# Patient Record
Sex: Male | Born: 1980 | Race: White | Hispanic: No | Marital: Married | Smoking: Never smoker
Health system: Southern US, Community
[De-identification: ages and names within clinical notes are randomized; demographics above are authoritative.]

## PROBLEM LIST (undated history)

## (undated) DIAGNOSIS — G709 Myoneural disorder, unspecified: Secondary | ICD-10-CM

## (undated) HISTORY — PX: SPINE SURGERY: SHX786

## (undated) HISTORY — DX: Myoneural disorder, unspecified: G70.9

---

## 2014-05-26 ENCOUNTER — Ambulatory Visit (INDEPENDENT_AMBULATORY_CARE_PROVIDER_SITE_OTHER): Payer: Self-pay | Admitting: Physician Assistant

## 2014-05-26 VITALS — BP 118/80 | HR 108 | Temp 102.6°F | Resp 18 | Ht 68.0 in | Wt 180.8 lb

## 2014-05-26 DIAGNOSIS — R509 Fever, unspecified: Secondary | ICD-10-CM

## 2014-05-26 DIAGNOSIS — J101 Influenza due to other identified influenza virus with other respiratory manifestations: Secondary | ICD-10-CM

## 2014-05-26 DIAGNOSIS — R05 Cough: Secondary | ICD-10-CM

## 2014-05-26 DIAGNOSIS — R059 Cough, unspecified: Secondary | ICD-10-CM

## 2014-05-26 LAB — POCT INFLUENZA A/B
INFLUENZA A, POC: POSITIVE
INFLUENZA B, POC: NEGATIVE

## 2014-05-26 MED ORDER — HYDROCODONE-HOMATROPINE 5-1.5 MG/5ML PO SYRP: 5.0000 mL | ORAL_SOLUTION | Freq: Three times a day (TID) | ORAL | Status: DC | PRN

## 2014-05-26 MED ORDER — ACETAMINOPHEN 325 MG PO TABS
1000.0000 mg | ORAL_TABLET | Freq: Once | ORAL | Status: AC
  Administered 2014-05-26: 975 mg via ORAL

## 2014-05-26 MED ORDER — OSELTAMIVIR PHOSPHATE 75 MG PO CAPS: 75.0000 mg | ORAL_CAPSULE | Freq: Two times a day (BID) | ORAL | Status: DC

## 2014-05-26 MED ORDER — IBUPROFEN 800 MG PO TABS: ORAL_TABLET | ORAL | Status: DC

## 2014-05-26 NOTE — Progress Notes (Signed)
05/26/2014 at 11:23 AM  Brian Simon / DOB: 06-16-1980 / MRN: [id number]  The patient  does not have a problem list on file.  SUBJECTIVE  Chief compalaint: Fever and Cough   History of present illness: Brian Simon is a nonsmoking 34 y.o. ill appearing male with a history of neurofibromatosis presenting for moderatly high fever. This problem has been present for 2 days and is not getting worse or better. Associated symptoms include cough, HA, myalgias and he denies sore throat, neck pain, rhinorrhea, and sinus congestion, belly pain, nausea, and changes in fluid intake or appetitie. He has tried Nyquil and Dayquil with good to fair relief. He reports his fiancee is here because she has been ill.    He  has a past medical history of Neuromuscular disorder.    He currently has no medications in their medication list.  Brian Simon has No Known Allergies. He  reports that he has never smoked. He does not have any smokeless tobacco history on file. He reports that he drinks alcohol. He reports that he does not use illicit drugs. He  has no sexual activity history on file.  The patient  has past surgical history that includes Spine surgery.  His family history includes Cancer in his paternal grandfather and paternal grandmother; Stroke in his maternal grandfather.  Review of Systems  Constitutional: Positive for fever and chills.  Respiratory: Positive for cough. Negative for shortness of breath and wheezing.   Cardiovascular: Negative for chest pain and palpitations.  Gastrointestinal: Negative.   Genitourinary: Negative.   Skin: Negative.   Neurological: Positive for headaches. Negative for dizziness.    OBJECTIVE  his  height is  (1.727 m) and weight is 180 lb 12.8 oz (82.01 kg). His oral temperature is 102.6 F (39.2 C). His blood pressure is 118/80 and his pulse is 108. His respiration is 18 and oxygen saturation is 98%.  The patient's body mass index is 27.5  kg/(m^2).  Physical Exam  Vitals reviewed. Constitutional: He is oriented to person, place, and time. He appears well-developed and well-nourished. No distress.  HENT:  Right Ear: Hearing, tympanic membrane, external ear and ear canal normal.  Left Ear: Hearing, tympanic membrane, external ear and ear canal normal.  Nose: Nose normal.  Mouth/Throat: Uvula is midline, oropharynx is clear and moist and mucous membranes are normal.  Eyes:  Glassy  Cardiovascular: Normal rate, regular rhythm and normal heart sounds.  Exam reveals no gallop and no friction rub.   No murmur heard. Respiratory: Effort normal and breath sounds normal. He has no wheezes. He has no rales.  GI: Bowel sounds are normal. He exhibits no distension and no mass. There is no tenderness. There is no rebound and no guarding.  Lymphadenopathy:    He has no cervical adenopathy.  Neurological: He is alert and oriented to person, place, and time.  Skin: Skin is warm and dry.  Psychiatric: He has a normal mood and affect.    Results for orders placed or performed in visit on 05/26/14 (from the past 24 hour(s))  POCT Influenza A/B     Status: None   Collection Time: 05/26/14 10:03 AM  Result Value Ref Range   Influenza A, POC Positive    Influenza B, POC Negative     ASSESSMENT & PLAN  Press was seen today for fever and cough.  Diagnoses and associated orders for this visit:  Fever, unspecified fever cause - acetaminophen (TYLENOL) tablet 975 mg;  Take 3 tablets (975 mg total) by mouth once. - POCT Influenza A/B  Influenza A: Anticipatory guidance provided.  He is to avoid the very young, pregnant individuals, and the elderly or infirmed. Advised he should be getting better in a day or two, and if not, or if he becomes dyspneic to represent.    - oseltamivir (TAMIFLU) 75 MG capsule; Take 1 capsule (75 mg total) by mouth 2 (two) times daily. - ibuprofen (ADVIL,MOTRIN) 800 MG tablet; Take 1 tab every eight hours for  pain  Cough - HYDROcodone-homatropine (HYCODAN) 5-1.5 MG/5ML syrup; Take 5 mLs by mouth every 8 (eight) hours as needed for cough.     The patient was instructed to to call or comeback to clinic as needed, or should symptoms warrant.  Brian BostonMichael Swati Simon, MHS, PA-C [facility] 05/26/2014 11:23 AM

## 2016-05-24 ENCOUNTER — Ambulatory Visit (INDEPENDENT_AMBULATORY_CARE_PROVIDER_SITE_OTHER): Payer: BC Managed Care – PPO | Admitting: Physician Assistant

## 2016-05-24 VITALS — BP 122/72 | HR 78 | Temp 98.0°F | Resp 17 | Ht 68.5 in | Wt 187.0 lb

## 2016-05-24 DIAGNOSIS — J069 Acute upper respiratory infection, unspecified: Secondary | ICD-10-CM | POA: Diagnosis not present

## 2016-05-24 DIAGNOSIS — B9789 Other viral agents as the cause of diseases classified elsewhere: Secondary | ICD-10-CM | POA: Diagnosis not present

## 2016-05-24 NOTE — Progress Notes (Signed)
  05/24/2016 3:04 PM   DOB: 05/04/1980 / MRN: [id number]  SUBJECTIVE:  Brian Simon is a 36 y.o. male presenting for "feeling under the weather since Sunday."  States this has been fever which he has measured at 99.6.  Complains of non productive cough and muscle aches. The cough is not worse at night. He is a never smoker and denies a history of asthma.   He has No Known Allergies.   Review of Systems  HENT: Positive for sore throat. Negative for congestion, ear pain and sinus pain.   Respiratory: Positive for cough. Negative for hemoptysis, shortness of breath and wheezing.   Cardiovascular: Negative for chest pain and leg swelling.  Skin: Negative for rash.  Neurological: Positive for headaches. Negative for dizziness.    The problem list and medications were reviewed and updated by myself where necessary and exist elsewhere in the encounter.   OBJECTIVE:  BP 122/72 (BP Location: Right Arm, Patient Position: Sitting, Cuff Size: Normal)   Pulse 78   Temp 98 F (36.7 C) (Oral)   Resp 17   Ht 5' 8.5" (1.74 m)   Wt 187 lb (84.8 kg)   SpO2 97%   BMI 28.02 kg/m   Physical Exam  Constitutional: He is oriented to person, place, and time. He appears well-developed. He does not appear ill.  HENT:  Right Ear: Tympanic membrane normal.  Left Ear: Tympanic membrane normal.  Nose: Nose normal.  Mouth/Throat: Uvula is midline, oropharynx is clear and moist and mucous membranes are normal.  Eyes: Conjunctivae and EOM are normal. Pupils are equal, round, and reactive to light.  Cardiovascular: Normal rate, regular rhythm and normal heart sounds.  Exam reveals no gallop, no friction rub and no decreased pulses.   No murmur heard. Pulmonary/Chest: Effort normal and breath sounds normal. No respiratory distress. He has no decreased breath sounds. He has no wheezes. He has no rhonchi. He has no rales.  Abdominal: He exhibits no distension.  Musculoskeletal: Normal range of motion. He  exhibits no edema.  Neurological: He is alert and oriented to person, place, and time. No cranial nerve deficit. Coordination normal.  Skin: Skin is warm and dry. He is not diaphoretic.  Psychiatric: He has a normal mood and affect.  Nursing note and vitals reviewed.   No results found for this or any previous visit (from the past 72 hour(s)).  No results found.  ASSESSMENT AND PLAN:  Brian Simon was seen today for uri, cough and sore throat.  Diagnoses and all orders for this visit:  Viral URI: Reassuring exam.  Advised rest and OTC cold prep.  Advised it may take 10 or so days of total illness before he makes a full recovery.     The patient is advised to call or return to clinic if he does not see an improvement in symptoms, or to seek the care of the closest emergency department if he worsens with the above plan.   Deliah BostonMichael Javarri Segal, MHS, PA-C [facility] 05/24/2016 3:04 PM

## 2016-05-24 NOTE — Patient Instructions (Signed)
     IF you received an x-ray today, you will receive an invoice from [facility]. Please contact [facility] at [phone] with questions or concerns regarding your invoice.   IF you received labwork today, you will receive an invoice from [facility]. Please contact [facility] at [phone] with questions or concerns regarding your invoice.   Our billing staff will not be able to assist you with questions regarding bills from these companies.  You will be contacted with the lab results as soon as they are available. The fastest way to get your results is to activate your My Chart account. Instructions are located on the last page of this paperwork. If you have not heard from us regarding the results in 2 weeks, please contact this office.     

## 2016-09-15 ENCOUNTER — Ambulatory Visit (INDEPENDENT_AMBULATORY_CARE_PROVIDER_SITE_OTHER): Payer: BC Managed Care – PPO | Admitting: Family Medicine

## 2016-09-15 ENCOUNTER — Encounter: Payer: Self-pay | Admitting: Family Medicine

## 2016-09-15 VITALS — BP 138/84 | HR 85 | Temp 99.5°F | Resp 17 | Ht 68.5 in | Wt 186.0 lb

## 2016-09-15 DIAGNOSIS — R509 Fever, unspecified: Secondary | ICD-10-CM

## 2016-09-15 DIAGNOSIS — B349 Viral infection, unspecified: Secondary | ICD-10-CM

## 2016-09-15 DIAGNOSIS — R05 Cough: Secondary | ICD-10-CM

## 2016-09-15 DIAGNOSIS — R059 Cough, unspecified: Secondary | ICD-10-CM

## 2016-09-15 MED ORDER — BENZONATATE 100 MG PO CAPS: 100.0000 mg | ORAL_CAPSULE | Freq: Three times a day (TID) | ORAL | 0 refills | Status: DC | PRN

## 2016-09-15 NOTE — Progress Notes (Signed)
Patient ID: ADETOKUNBO MCCADDEN, male    DOB: [birth year]  Age: 36 y.o. MRN: [id number]  Chief Complaint  Patient presents with  . Cough  . Fever    Subjective:   36 year old gentleman who is here with a respiratory tract infection for the past couple days. He had a lot of sinus congestion type symptoms yesterday. He has been coughing. He also had a fever, up to 102 at times. He has felt bad. He did continue to work yesterday. He went to work this morning and is here to be checked but plans to do his usual work today and tomorrow, though he would like a little lighter duty. His wife is sick couple weeks ago and had some Delsym.  He is generally pretty healthy though he has a history of neurofibromatosis and this has surgeries related to that. Not on any regular medications.  Current allergies, medications, problem list, past/family and social histories reviewed.  Objective:  BP 138/84   Pulse 85   Temp 99.5 F (37.5 C) (Oral)   Resp 17   Ht 5' 8.5" (1.74 m)   Wt 186 lb (84.4 kg)   SpO2 98%   BMI 27.87 kg/m   No major acute distress. TMs normal. Throat clear. Uvula is a tiny bit swollen. Neck supple without significant nodes. Chest is clear to auscultation. Heart regular without murmur.  Assessment & Plan:   Assessment: 1. Viral syndrome   2. Cough   3. Fever, unspecified fever cause       Plan: See instructions. This is a viral syndrome and will have to be treated symptomatically. He is not sure whether he had a flu shot back in the fall, but his symptoms are more that of a bad viral cold infection rather than influenza and I'm not testing him today.  No orders of the defined types were placed in this encounter.   Meds ordered this encounter  Medications  . ibuprofen (ADVIL,MOTRIN) 200 MG tablet    Sig: Take 200 mg by mouth every 6 (six) hours as needed.         Patient Instructions   Drink plenty of fluids and get enough rest  Take ibuprofen 200 mg 3 or 4 pills  3 times daily as needed for fever. If necessary you can also take Tylenol (acetaminophen) 500 mg 2 pills 3 times daily  Use the Delsym that you have at home as needed for nighttime cough.  He is benzonatate cough pills one or 2 pills 3 times daily if needed for daytime cough  Take an over-the-counter antihistamine decongestant such as Claritin-D or Allegra-D or Zyrtec-D (loratadine D, fexofenadine D, or cetirizine D) daily for head congestion  This should run its course in 5 or 6 days. If you're getting worse at any time please return. Try to take it a little easier at work.     IF you received an x-ray today, you will receive an invoice from [facility]. Please contact [facility] at [phone] with questions or concerns regarding your invoice.   IF you received labwork today, you will receive an invoice from [facility]. Please contact [facility] at [phone] with questions or concerns regarding your invoice.   Our billing staff will not be able to assist you with questions regarding bills from these companies.  You will be contacted with the lab results as soon as they are available. The fastest way to get your results is to activate your My Chart account. Instructions are located  on the last page of this paperwork. If you have not heard from us regarding the results in 2 weeks, please contact this office.         Return if symptoms worsen or fail to improve.   HOPPER,DAVID, MD 09/15/2016

## 2016-09-15 NOTE — Patient Instructions (Addendum)
Drink plenty of fluids and get enough rest  Take ibuprofen 200 mg 3 or 4 pills 3 times daily as needed for fever. If necessary you can also take Tylenol (acetaminophen) 500 mg 2 pills 3 times daily  Use the Delsym that you have at home as needed for nighttime cough.  He is benzonatate cough pills one or 2 pills 3 times daily if needed for daytime cough  Take an over-the-counter antihistamine decongestant such as Claritin-D or Allegra-D or Zyrtec-D (loratadine D, fexofenadine D, or cetirizine D) daily for head congestion  This should run its course in 5 or 6 days. If you're getting worse at any time please return. Try to take it a little easier at work.     IF you received an x-ray today, you will receive an invoice from [facility]. Please contact [facility] at [phone] with questions or concerns regarding your invoice.   IF you received labwork today, you will receive an invoice from [facility]. Please contact [facility] at [phone] with questions or concerns regarding your invoice.   Our billing staff will not be able to assist you with questions regarding bills from these companies.  You will be contacted with the lab results as soon as they are available. The fastest way to get your results is to activate your My Chart account. Instructions are located on the last page of this paperwork. If you have not heard from us regarding the results in 2 weeks, please contact this office.

## 2018-04-25 ENCOUNTER — Other Ambulatory Visit: Payer: Self-pay

## 2018-04-25 ENCOUNTER — Ambulatory Visit (HOSPITAL_COMMUNITY)
Admission: EM | Admit: 2018-04-25 | Discharge: 2018-04-25 | Disposition: A | Discharge status: Home / Self Care | Payer: BC Managed Care – PPO | Attending: Family Medicine | Admitting: Family Medicine

## 2018-04-25 ENCOUNTER — Encounter (HOSPITAL_COMMUNITY): Payer: Self-pay | Admitting: Emergency Medicine

## 2018-04-25 DIAGNOSIS — R69 Illness, unspecified: Secondary | ICD-10-CM | POA: Insufficient documentation

## 2018-04-25 DIAGNOSIS — J111 Influenza due to unidentified influenza virus with other respiratory manifestations: Secondary | ICD-10-CM

## 2018-04-25 MED ORDER — OSELTAMIVIR PHOSPHATE 75 MG PO CAPS: 75.0000 mg | ORAL_CAPSULE | Freq: Two times a day (BID) | ORAL | 0 refills | Status: DC

## 2018-04-25 NOTE — ED Triage Notes (Signed)
PT reports a fever with no other symptoms for a few days. PT reports highest temp at home was 102.3  PT has taken ibuprofen and tylenol on rotating schedule without fever relief.

## 2018-04-25 NOTE — Discharge Instructions (Addendum)
We will go ahead and treat for flu with tamiflu Every 12 hours x 5 days. Tylenol/ibuprofen for the fever and aches Stay hydrated.  Follow up as needed for continued or worsening symptoms

## 2018-04-29 NOTE — ED Provider Notes (Signed)
[facility]    CSN: [id number] Arrival date & time: 04/25/18  1114     History   Chief Complaint Chief Complaint  Patient presents with  . Fever    HPI Brian Simon is a 38 y.o. male.   Patient is a 38 year old male presents with 3 days of fever and body aches.  His symptoms have been constant.  He has been taking ibuprofen to treat the fever.  His symptoms are somewhat relieved with the medication.  He denies any other associated symptoms to include cough, congestion, rhinorrhea, sore throat, ear pain, nausea, vomiting, diarrhea.  Denies any known recent sick contacts.  Denies any recent traveling.  No rashes  ROS per HPI      Past Medical History:  Diagnosis Date  . Neuromuscular disorder (HCC)     There are no active problems to display for this patient.   Past Surgical History:  Procedure Laterality Date  . SPINE SURGERY         Home Medications    Prior to Admission medications   Medication Sig Start Date End Date Taking? Authorizing Provider  ibuprofen (ADVIL,MOTRIN) 200 MG tablet Take 200 mg by mouth every 6 (six) hours as needed.   Yes [provider]  benzonatate (TESSALON) 100 MG capsule Take 1-2 capsules (100-200 mg total) by mouth 3 (three) times daily as needed. 09/15/16   Peyton NajjarHopper, David H, MD  oseltamivir (TAMIFLU) 75 MG capsule Take 1 capsule (75 mg total) by mouth every 12 (twelve) hours. 04/25/18   Janace ArisBast, Paetyn Pietrzak A, NP    Family History Family History  Problem Relation Age of Onset  . Stroke Maternal Grandfather   . Cancer Paternal Grandmother   . Cancer Paternal Grandfather     Social History Social History   Tobacco Use  . Smoking status: Never Smoker  . Smokeless tobacco: Never Used  Substance Use Topics  . Alcohol use: Yes    Alcohol/week: 0.0 standard drinks  . Drug use: No     Allergies   Patient has no known allergies.   Review of Systems Review of Systems   Physical Exam Triage Vital Signs ED  Triage Vitals [04/25/18 1315]  Enc Vitals Group     BP (!) 149/89     Pulse Rate (!) 105     Resp 16     Temp 99.8 F (37.7 C)     Temp Source Oral     SpO2 97 %     Weight      Height      Head Circumference      Peak Flow      Pain Score 0     Pain Loc      Pain Edu?      Excl. in GC?    No data found.  Updated Vital Signs BP (!) 149/89   Pulse (!) 105   Temp 99.8 F (37.7 C) (Oral)   Resp 16   SpO2 97%   Visual Acuity Right Eye Distance:   Left Eye Distance:   Bilateral Distance:    Right Eye Near:   Left Eye Near:    Bilateral Near:     Physical Exam Vitals signs and nursing note reviewed.  Constitutional:      General: He is not in acute distress.    Appearance: Normal appearance. He is not ill-appearing, toxic-appearing or diaphoretic.  HENT:     Head: Normocephalic and atraumatic.     Right  Ear: Tympanic membrane, ear canal and external ear normal.     Left Ear: Tympanic membrane, ear canal and external ear normal.     Nose: Nose normal.     Mouth/Throat:     Mouth: Mucous membranes are moist.     Pharynx: Oropharynx is clear.  Eyes:     Conjunctiva/sclera: Conjunctivae normal.  Neck:     Musculoskeletal: Normal range of motion.  Cardiovascular:     Rate and Rhythm: Tachycardia present.     Heart sounds: Normal heart sounds.  Pulmonary:     Effort: Pulmonary effort is normal.     Breath sounds: Normal breath sounds.  Musculoskeletal: Normal range of motion.  Lymphadenopathy:     Cervical: No cervical adenopathy.  Skin:    General: Skin is warm and dry.  Neurological:     Mental Status: He is alert.  Psychiatric:        Mood and Affect: Mood normal.      UC Treatments / Results  Labs (all labs ordered are listed, but only abnormal results are displayed) Labs Reviewed - No data to display  EKG None  Radiology No results found.  Procedures Procedures (including critical care time)  Medications Ordered in UC Medications - No  data to display  Initial Impression / Assessment and Plan / UC Course  I have reviewed the triage vital signs and the nursing notes.  Pertinent labs & imaging results that were available during my care of the patient were reviewed by me and considered in my medical decision making (see chart for details).     Exam mostly normal Vital signs stable, patient is nontoxic or ill-appearing. Most likely this is flulike illness We will go ahead and treat the symptoms with Tamiflu Tylenol/ibuprofen for the fever and body aches Follow up as needed for continued or worsening symptoms  Final Clinical Impressions(s) / UC Diagnoses   Final diagnoses:  Influenza-like illness     Discharge Instructions     We will go ahead and treat for flu with tamiflu Every 12 hours x 5 days. Tylenol/ibuprofen for the fever and aches Stay hydrated.  Follow up as needed for continued or worsening symptoms     ED Prescriptions    Medication Sig Dispense Auth. Provider   oseltamivir (TAMIFLU) 75 MG capsule Take 1 capsule (75 mg total) by mouth every 12 (twelve) hours. 10 capsule Dahlia Byes A, NP     Controlled Substance Prescriptions [place] Controlled Substance Registry consulted? Not Applicable   Janace Aris, NP 04/29/18 0830

## 2018-05-04 ENCOUNTER — Ambulatory Visit: Payer: BC Managed Care – PPO | Admitting: Physician Assistant

## 2018-05-04 ENCOUNTER — Ambulatory Visit (INDEPENDENT_AMBULATORY_CARE_PROVIDER_SITE_OTHER): Payer: BC Managed Care – PPO

## 2018-05-04 ENCOUNTER — Encounter: Payer: Self-pay | Admitting: Physician Assistant

## 2018-05-04 ENCOUNTER — Other Ambulatory Visit: Payer: Self-pay

## 2018-05-04 VITALS — BP 121/79 | HR 94 | Temp 98.3°F | Resp 16 | Ht 68.0 in | Wt 196.4 lb

## 2018-05-04 DIAGNOSIS — R509 Fever, unspecified: Secondary | ICD-10-CM | POA: Diagnosis not present

## 2018-05-04 DIAGNOSIS — D72820 Lymphocytosis (symptomatic): Secondary | ICD-10-CM

## 2018-05-04 DIAGNOSIS — R61 Generalized hyperhidrosis: Secondary | ICD-10-CM | POA: Diagnosis not present

## 2018-05-04 LAB — POCT URINALYSIS DIP (MANUAL ENTRY)
Blood, UA: NEGATIVE
Glucose, UA: NEGATIVE mg/dL
Leukocytes, UA: NEGATIVE
Nitrite, UA: NEGATIVE
Protein Ur, POC: 100 mg/dL — AB
Spec Grav, UA: >=1.03 — AB (ref 1.010–1.025)
Urobilinogen, UA: 1 E.U./dL
pH, UA: 5.5 (ref 5.0–8.0)

## 2018-05-04 LAB — POC MICROSCOPIC URINALYSIS (UMFC)

## 2018-05-04 LAB — POCT CBC
Granulocyte percent: 45 %G (ref 37–80)
HCT, POC: 43.9 % — AB (ref 29–41)
Hemoglobin: 15.1 g/dL — AB (ref 11–14.6)
Lymph, poc: 6.4 — AB (ref 0.6–3.4)
MCH, POC: 29.5 pg (ref 27–31.2)
MCHC: 34.4 g/dL (ref 31.8–35.4)
MCV: 85.7 fL (ref 76–111)
MID (cbc): 1.5 — AB (ref 0–0.9)
MPV: 7.1 fL (ref 0–99.8)
POC Granulocyte: 6.4 (ref 2–6.9)
POC LYMPH PERCENT: 44.7 %L (ref 10–50)
POC MID %: 10.3 %M (ref 0–12)
Platelet Count, POC: 258 10*3/uL (ref 142–424)
RBC: 5.13 M/uL (ref 4.69–6.13)
RDW, POC: 14.2 %
WBC: 14.3 10*3/uL — AB (ref 4.6–10.2)

## 2018-05-04 LAB — POCT INFLUENZA A/B
Influenza A, POC: NEGATIVE
Influenza B, POC: NEGATIVE

## 2018-05-04 NOTE — Patient Instructions (Addendum)
Your white blood cell count is elevated. I would like to make sure this is trending down and that your symptoms are not worsening.  Come back on Monday for recheck of your blood panel and to review the labs that were to sent out.   In the meantime stay well hydrated and drink plenty of water and electrolytes. If your symptoms worsen before Monday, go to the emergency department.    If you have lab work done today you will be contacted with your lab results within the next 2 weeks.  If you have not heard from [place] then please contact us. The fastest way to get your results is to register for My Chart.  Thank you for coming in today. I hope you feel we met your needs.  Feel free to call PCP if you have any questions or further requests.  Please consider signing up for MyChart if you do not already have it, as this is a great way to communicate with me.  Best,  Brian McVey, PA-C   IF you received an x-ray today, you will receive an invoice from [facility]. Please contact [facility] at [phone] with questions or concerns regarding your invoice.   IF you received labwork today, you will receive an invoice from [place]. Please contact [facility] at [phone] with questions or concerns regarding your invoice.   Our billing staff will not be able to assist you with questions regarding bills from these companies.  You will be contacted with the lab results as soon as they are available. The fastest way to get your results is to activate your My Chart account. Instructions are located on the last page of this paperwork. If you have not heard from [place] regarding the results in 2 weeks, please contact this office.

## 2018-05-04 NOTE — Progress Notes (Signed)
Brian Simon  MRN: [id number] DOB: [birth year]  PCP: Patient, No Pcp Per  Subjective:  Pt is a pleasant 38 year old male who presents to clinic for fever x 1 week.  Fevers of 99-102.5 the past week.  Endorses night sweats. "I wake up drenched" He lays down a towel twice/night.  He was seen at urgent care last week and Rx for Tamiflu this did not help - was not tested for the flu, treated emperically  He is taking 600-800 mg ibuprofen/daily.  He is drinking 1L water/day.  Denies body aches, chills, n/v/d, cough, sore throat, sinus pain, ear pain, runny nose, dyspnea, shob, abdominal pain, palpitations, chest pain, HA, rash, wound. He works at a [employer]. No known sick contacts. Lives with wife and step daughter.  No high risk sexual behaviors.    Review of Systems  Constitutional: Positive for diaphoresis and fever. Negative for chills and fatigue.  HENT: Negative for congestion, postnasal drip, rhinorrhea, sinus pressure, sinus pain, sneezing and sore throat.   Respiratory: Negative for cough, shortness of breath and wheezing.   Gastrointestinal: Negative for abdominal pain, diarrhea, nausea and vomiting.  Skin: Negative for rash and wound.    There are no active problems to display for this patient.   Current Outpatient Medications on File Prior to Visit  Medication Sig Dispense Refill  . ibuprofen (ADVIL,MOTRIN) 200 MG tablet Take 200 mg by mouth every 6 (six) hours as needed.    . benzonatate (TESSALON) 100 MG capsule Take 1-2 capsules (100-200 mg total) by mouth 3 (three) times daily as needed. (Patient not taking: Reported on 05/04/2018) 30 capsule 0  . oseltamivir (TAMIFLU) 75 MG capsule Take 1 capsule (75 mg total) by mouth every 12 (twelve) hours. (Patient not taking: Reported on 05/04/2018) 10 capsule 0   No current facility-administered medications on file prior to visit.     No Known Allergies   Objective:  BP 121/79 (BP Location: Right Arm, Patient Position:  Sitting, Cuff Size: Normal)   Pulse 94   Temp 98.3 F (36.8 C) (Oral)   Resp 16   Ht '5\' 8"'  (1.727 m)   Wt 196 lb 6.4 oz (89.1 kg)   SpO2 99%   BMI 29.86 kg/m   Physical Exam Vitals signs reviewed.  Constitutional:      General: He is not in acute distress. HENT:     Right Ear: Tympanic membrane normal.     Left Ear: Tympanic membrane normal.     Nose: Mucosal edema present. No rhinorrhea.     Right Sinus: No maxillary sinus tenderness or frontal sinus tenderness.     Left Sinus: No maxillary sinus tenderness or frontal sinus tenderness.     Mouth/Throat:     Mouth: Mucous membranes are moist.     Pharynx: Oropharynx is clear. Uvula midline.     Tonsils: No tonsillar exudate or tonsillar abscesses.  Cardiovascular:     Rate and Rhythm: Normal rate and regular rhythm.     Heart sounds: Normal heart sounds. No murmur. No friction rub.  Pulmonary:     Effort: Pulmonary effort is normal. No respiratory distress.     Breath sounds: Normal breath sounds. No wheezing, rhonchi or rales.  Skin:    General: Skin is warm and dry.  Neurological:     Mental Status: He is alert and oriented to person, place, and time.  Psychiatric:        Judgment: Judgment normal.  Dg Chest 2 View  Result Date: 05/04/2018 CLINICAL DATA:  Fever for 1 week.  Leukocytosis. EXAM: CHEST - 2 VIEW COMPARISON:  None. FINDINGS: The heart size and mediastinal contours are within normal limits. Both lungs are clear. The visualized skeletal structures are unremarkable. IMPRESSION: No active cardiopulmonary disease. Electronically Signed   By: Earle Gell M.D.   On: 05/04/2018 13:55   Results for orders placed or performed in visit on 05/04/18  POCT Influenza A/B  Result Value Ref Range   Influenza A, POC Negative Negative   Influenza B, POC Negative Negative  POCT CBC  Result Value Ref Range   WBC 14.3 (A) 4.6 - 10.2 K/uL   Lymph, poc 6.4 (A) 0.6 - 3.4   POC LYMPH PERCENT 44.7 10 - 50 %L   MID (cbc)  1.5 (A) 0 - 0.9   POC MID % 10.3 0 - 12 %M   POC Granulocyte 6.4 2 - 6.9   Granulocyte percent 45.0 37 - 80 %G   RBC 5.13 4.69 - 6.13 M/uL   Hemoglobin 15.1 (A) 11 - 14.6 g/dL   HCT, POC 43.9 (A) 29 - 41 %   MCV 85.7 76 - 111 fL   MCH, POC 29.5 27 - 31.2 pg   MCHC 34.4 31.8 - 35.4 g/dL   RDW, POC 14.2 %   Platelet Count, POC 258 142 - 424 K/uL   MPV 7.1 0 - 99.8 fL  POCT urinalysis dipstick  Result Value Ref Range   Color, UA orange (A) yellow   Clarity, UA clear clear   Glucose, UA negative negative mg/dL   Bilirubin, UA small (A) negative   Ketones, POC UA trace (5) (A) negative mg/dL   Spec Grav, UA >=1.030 (A) 1.010 - 1.025   Blood, UA negative negative   pH, UA 5.5 5.0 - 8.0   Protein Ur, POC =100 (A) negative mg/dL   Urobilinogen, UA 1.0 0.2 or 1.0 E.U./dL   Nitrite, UA Negative Negative   Leukocytes, UA Negative Negative  POCT Microscopic Urinalysis ([facility])  Result Value Ref Range   WBC,UR,HPF,POC Few (A) None WBC/hpf   RBC,UR,HPF,POC None None RBC/hpf   Bacteria Moderate (A) None, Too numerous to count   Mucus Present (A) Absent   Epithelial Cells, UR Per Microscopy None None, Too numerous to count cells/hpf    Assessment and Plan :  1. Fever, unspecified 2. Night sweats - POCT Influenza A/B - POCT CBC - POCT urinalysis dipstick - HIV Antibody (routine testing w rflx) - CMP14+EGFR - Sedimentation Rate - POCT Microscopic Urinalysis ([facility]) - DG Chest 2 View; Future - Pt presents c/o fever and night sweats x 1 week. No other symptoms. Treated but not tested for flu last week. NAD. Vitals are stable. Negative flu. WBC count is 14.3 and lymph 6.4. UA indicates pt needs to hydrate better - advised increasing fluid intake. Chest x-ray is negative. No indication at this time as to source of fever. He is low risk for HIV and RPR. Will check just in case as well as path smear and sed rate. RTC on Monday for recheck of symptoms, CBC and for lab result review. Precede to  emergency department if symptoms worsen. He understands and agrees with plan.   3. Elevated lymphocyte count - Pathologist smear review - RPR - DG Chest 2 View; Future   Mercer Pod, PA-C  [facility] 05/04/2018 12:45 PM  Please note: Portions of this  report may have been transcribed using dragon voice recognition software. Every effort was made to ensure accuracy; however, inadvertent computerized transcription errors may be present.

## 2018-05-05 LAB — CMP14+EGFR
ALT: 150 IU/L — ABNORMAL HIGH (ref 0–44)
AST: 64 IU/L — ABNORMAL HIGH (ref 0–40)
Albumin/Globulin Ratio: 1.2 (ref 1.2–2.2)
Albumin: 3.8 g/dL (ref 3.5–5.5)
Alkaline Phosphatase: 85 IU/L (ref 39–117)
BUN/Creatinine Ratio: 14 (ref 9–20)
BUN: 13 mg/dL (ref 6–20)
Bilirubin Total: 0.9 mg/dL (ref 0.0–1.2)
CO2: 22 mmol/L (ref 20–29)
Calcium: 9.1 mg/dL (ref 8.7–10.2)
Chloride: 103 mmol/L (ref 96–106)
Creatinine, Ser: 0.93 mg/dL (ref 0.76–1.27)
GFR calc Af Amer: 120 mL/min/{1.73_m2} (ref >59)
GFR calc non Af Amer: 104 mL/min/{1.73_m2} (ref >59)
Globulin, Total: 3.3 g/dL (ref 1.5–4.5)
Glucose: 85 mg/dL (ref 65–99)
Potassium: 4.4 mmol/L (ref 3.5–5.2)
Sodium: 137 mmol/L (ref 134–144)
Total Protein: 7.1 g/dL (ref 6.0–8.5)

## 2018-05-05 LAB — SEDIMENTATION RATE: Sed Rate: 19 mm/hr — ABNORMAL HIGH (ref 0–15)

## 2018-05-05 LAB — RPR: RPR Ser Ql: NONREACTIVE

## 2018-05-05 LAB — HIV ANTIBODY (ROUTINE TESTING W REFLEX): HIV Screen 4th Generation wRfx: NONREACTIVE

## 2018-05-06 ENCOUNTER — Other Ambulatory Visit: Payer: Self-pay

## 2018-05-06 ENCOUNTER — Ambulatory Visit: Payer: BC Managed Care – PPO | Admitting: Emergency Medicine

## 2018-05-06 ENCOUNTER — Encounter: Payer: Self-pay | Admitting: Emergency Medicine

## 2018-05-06 VITALS — BP 124/84 | HR 88 | Temp 99.2°F | Resp 16 | Ht 67.0 in | Wt 199.4 lb

## 2018-05-06 DIAGNOSIS — R509 Fever, unspecified: Secondary | ICD-10-CM | POA: Diagnosis not present

## 2018-05-06 LAB — POCT CBC
Granulocyte percent: 43 %G (ref 37–80)
HEMATOCRIT: 40.2 % (ref 29–41)
Hemoglobin: 13.7 g/dL (ref 11–14.6)
LYMPH, POC: 6.8 — AB (ref 0.6–3.4)
MCH, POC: 28.4 pg (ref 27–31.2)
MCHC: 34 g/dL (ref 31.8–35.4)
MCV: 83.6 fL (ref 76–111)
MID (cbc): 1.2 — AB (ref 0–0.9)
MPV: 6.7 fL (ref 0–99.8)
POC GRANULOCYTE: 6 (ref 2–6.9)
POC LYMPH PERCENT: 48.6 %L — AB (ref 10–50)
POC MID %: 8.4 %M (ref 0–12)
Platelet Count, POC: 264 10*3/uL (ref 142–424)
RBC: 4.81 M/uL (ref 4.69–6.13)
RDW, POC: 14.7 %
WBC: 13.9 10*3/uL — AB (ref 4.6–10.2)

## 2018-05-06 LAB — POCT URINALYSIS DIP (MANUAL ENTRY)
Bilirubin, UA: NEGATIVE
Blood, UA: NEGATIVE
Glucose, UA: NEGATIVE mg/dL
Ketones, POC UA: NEGATIVE mg/dL
LEUKOCYTES UA: NEGATIVE
Nitrite, UA: NEGATIVE
Protein Ur, POC: NEGATIVE mg/dL
Spec Grav, UA: 1.015 (ref 1.010–1.025)
Urobilinogen, UA: 1 E.U./dL
pH, UA: 5.5 (ref 5.0–8.0)

## 2018-05-06 MED ORDER — DOXYCYCLINE HYCLATE 100 MG PO TABS: 100.0000 mg | ORAL_TABLET | Freq: Two times a day (BID) | ORAL | 0 refills | Status: AC

## 2018-05-06 NOTE — Patient Instructions (Addendum)
If you have lab work done today you will be contacted with your lab results within the next 2 weeks.  If you have not heard from [place] then please contact us. The fastest way to get your results is to register for My Chart.   IF you received an x-ray today, you will receive an invoice from [facility]. Please contact [facility] at [phone] with questions or concerns regarding your invoice.   IF you received labwork today, you will receive an invoice from [facility]. Please contact [facility] at [phone] with questions or concerns regarding your invoice.   Our billing staff will not be able to assist you with questions regarding bills from these companies.  You will be contacted with the lab results as soon as they are available. The fastest way to get your results is to activate your My Chart account. Instructions are located on the last page of this paperwork. If you have not heard from [place] regarding the results in 2 weeks, please contact this office.     Viral Illness, Adult Viruses are tiny germs that can get into a person's body and cause illness. There are many different types of viruses, and they cause many types of illness. Viral illnesses can range from mild to severe. They can affect various parts of the body. Common illnesses that are caused by a virus include colds and the flu. Viral illnesses also include serious conditions such as HIV/AIDS (human immunodeficiency virus/acquired immunodeficiency syndrome). A few viruses have been linked to certain cancers. What are the causes? Many types of viruses can cause illness. Viruses invade cells in your body, multiply, and cause the infected cells to malfunction or die. When the cell dies, it releases more of the virus. When this happens, you develop symptoms of the illness, and the virus continues to spread to other cells. If the virus takes over the function of the cell, it can cause the cell to divide and grow out  of control, as is the case when a virus causes cancer. Different viruses get into the body in different ways. You can get a virus by:  Swallowing food or water that is contaminated with the virus.  Breathing in droplets that have been coughed or sneezed into the air by an infected person.  Touching a surface that has been contaminated with the virus and then touching your eyes, nose, or mouth.  Being bitten by an insect or animal that carries the virus.  Having sexual contact with a person who is infected with the virus.  Being exposed to blood or fluids that contain the virus, either through an open cut or during a transfusion. If a virus enters your body, your body's defense system (immune system) will try to fight the virus. You may be at higher risk for a viral illness if your immune system is weak. What are the signs or symptoms? Symptoms vary depending on the type of virus and the location of the cells that it invades. Common symptoms of the main types of viral illnesses include: Cold and flu viruses  Fever.  Headache.  Sore throat.  Muscle aches.  Nasal congestion.  Cough. Digestive system (gastrointestinal) viruses  Fever.  Abdominal pain.  Nausea.  Diarrhea. Liver viruses (hepatitis)  Loss of appetite.  Tiredness.  Yellowing of the skin (jaundice). Brain and spinal cord viruses  Fever.  Headache.  Stiff neck.  Nausea and vomiting.  Confusion or sleepiness. Skin viruses  Warts.  Itching.  Rash. Sexually transmitted viruses  Discharge.  Swelling.  Redness.  Rash. How is this treated? Viruses can be difficult to treat because they live within cells. Antibiotic medicines do not treat viruses because these drugs do not get inside cells. Treatment for a viral illness may include:  Resting and drinking plenty of fluids.  Medicines to relieve symptoms. These can include over-the-counter medicine for pain and fever, medicines for cough or  congestion, and medicines to relieve diarrhea.  Antiviral medicines. These drugs are available only for certain types of viruses. They may help reduce flu symptoms if taken early. There are also many antiviral medicines for hepatitis and HIV/AIDS. Some viral illnesses can be prevented with vaccinations. A common example is the flu shot. Follow these instructions at home: Medicines   Take over-the-counter and prescription medicines only as told by your health care provider.  If you were prescribed an antiviral medicine, take it as told by your health care provider. Do not stop taking the medicine even if you start to feel better.  Be aware of when antibiotics are needed and when they are not needed. Antibiotics do not treat viruses. If your health care provider thinks that you may have a bacterial infection as well as a viral infection, you may get an antibiotic. ? Do not ask for an antibiotic prescription if you have been diagnosed with a viral illness. That will not make your illness go away faster. ? Frequently taking antibiotics when they are not needed can lead to antibiotic resistance. When this develops, the medicine no longer works against the bacteria that it normally fights. General instructions  Drink enough fluids to keep your urine clear or pale yellow.  Rest as much as possible.  Return to your normal activities as told by your health care provider. Ask your health care provider what activities are safe for you.  Keep all follow-up visits as told by your health care provider. This is important. How is this prevented? Take these actions to reduce your risk of viral infection:  Eat a healthy diet and get enough rest.  Wash your hands often with soap and water. This is especially important when you are in public places. If soap and water are not available, use hand sanitizer.  Avoid close contact with friends and family who have a viral illness.  If you travel to areas  where viral gastrointestinal infection is common, avoid drinking water or eating raw food.  Keep your immunizations up to date. Get a flu shot every year as told by your health care provider.  Do not share toothbrushes, nail clippers, razors, or needles with other people.  Always practice safe sex.  Contact a health care provider if:  You have symptoms of a viral illness that do not go away.  Your symptoms come back after going away.  Your symptoms get worse. Get help right away if:  You have trouble breathing.  You have a severe headache or a stiff neck.  You have severe vomiting or abdominal pain. This information is not intended to replace advice given to you by your health care provider. Make sure you discuss any questions you have with your health care provider. Document Released: 08/20/2015 Document Revised: 09/22/2015 Document Reviewed: 08/20/2015 Elsevier Interactive Patient Education  2019 [employer]  Fever, Adult     A fever is an increase in your body's temperature. It often means a temperature of 100.65F (38C) or higher. Brief mild or moderate fevers often have no long-term  effects. They often do not need treatment. Moderate or high fevers may make you feel uncomfortable. Sometimes, they can be a sign of a serious illness or disease. A fever that keeps coming back or that lasts a long time may cause you to lose water in your body (get dehydrated). You can take your temperature with a thermometer to see if you have a fever. Temperature can change with:  Age.  Time of day.  Where the thermometer is put in the body. Readings may vary when the thermometer is put: ? In the mouth (oral). ? In the butt (rectal). ? In the ear (tympanic). ? Under the arm (axillary). ? On the forehead (temporal). Follow these instructions at home: Medicines  Take over-the-counter and prescription medicines only as told by your doctor. Follow the dosing instructions  carefully.  If you were prescribed an antibiotic medicine, take it as told by your doctor. Do not stop taking it even if you start to feel better. General instructions  Watch for any changes in your symptoms. Tell your doctor about them.  Rest as needed.  Drink enough fluid to keep your pee (urine) pale yellow.  Sponge yourself or bathe with room-temperature water as needed. This helps to lower your body temperature. Do not use ice water.  Do not use too many blankets or wear clothes that are too heavy.  If your fever was caused by an infection that spreads from person to person (is contagious), such as a cold or the flu: ? You should stay home from work and public places for at least 24 hours after your fever is gone. ? Your fever should be gone for at least 24 hours without the need to use medicines. Contact a doctor if:  You throw up (vomit).  You cannot eat or drink without throwing up.  You have watery poop (diarrhea).  It hurts when you pee.  Your symptoms do not get better with treatment.  You have new symptoms.  You feel very weak. Get help right away if:  You are short of breath or have trouble breathing.  You are dizzy or you pass out (faint).  You feel mixed up (confused).  You have signs of not having enough water in your body, such as: ? Dark pee, very little pee, or no pee. ? Cracked lips. ? Dry mouth. ? Sunken eyes. ? Sleepiness. ? Weakness.  You have very bad pain in your belly (abdomen).  You keep throwing up or having watery poop.  You have a rash on your skin.  Your symptoms get worse all of a sudden. Summary  A fever is an increase in your body's temperature. It often means a temperature of 100.90F (38C) or higher.  Watch for any changes in your symptoms. Tell your doctor about them.  Take all medicines only as told by your doctor.  Do not go to work or other public places if your fever was caused by an illness that can spread to  other people.  Get help right away if you have signs that you do not have enough water in your body. This information is not intended to replace advice given to you by your health care provider. Make sure you discuss any questions you have with your health care provider. Document Released: 01/18/2008 Document Revised: 09/24/2017 Document Reviewed: 09/24/2017 Elsevier Interactive Patient Education  2019 [employer].

## 2018-05-06 NOTE — Progress Notes (Signed)
Brian Simon 38 y.o.   Chief Complaint  Patient presents with  . Follow-up    from 05/04/18, per pt "I feel normal", I feel like I have a fever in the middle of my forehead, since the Ibuprofen has worn off." last taken at 10:30 am    HISTORY OF PRESENT ILLNESS: This is a 38 y.o. male here for follow-up of visit on 05/04/2018.  Seen with intermittent fever and chills, mostly at night, with periods of heavy sweating.  Denies respiratory symptoms.  Denies abdominal pain, nausea or vomiting.  No recent traveling abroad.  No one else sick at home.  No significant exposure.  Blood work done 2 days ago showed leukocytosis, mild elevation of liver enzymes, white cells and bacteria in the urine.  Negative flu tests, normal chest x-ray, urine culture not done, negative HIV and RPR.  No new symptoms.  Feels about the same.  HPI   Prior to Admission medications   Medication Sig Start Date End Date Taking? Authorizing Provider  ibuprofen (ADVIL,MOTRIN) 200 MG tablet Take 200 mg by mouth every 6 (six) hours as needed.   Yes [provider]    No Known Allergies  There are no active problems to display for this patient.   Past Medical History:  Diagnosis Date  . Neuromuscular disorder [facility])     Past Surgical History:  Procedure Laterality Date  . SPINE SURGERY      Social History   Socioeconomic History  . Marital status: Significant Other    Spouse name: Not on file  . Number of children: Not on file  . Years of education: Not on file  . Highest education level: Not on file  Occupational History  . Not on file  Social Needs  . Financial resource strain: Not on file  . Food insecurity:    Worry: Not on file    Inability: Not on file  . Transportation needs:    Medical: Not on file    Non-medical: Not on file  Tobacco Use  . Smoking status: Never Smoker  . Smokeless tobacco: Never Used  Substance and Sexual Activity  . Alcohol use: Yes    Alcohol/week: 0.0  standard drinks  . Drug use: No  . Sexual activity: Never  Lifestyle  . Physical activity:    Days per week: Not on file    Minutes per session: Not on file  . Stress: Not on file  Relationships  . Social connections:    Talks on phone: Not on file    Gets together: Not on file    Attends religious service: Not on file    Active member of club or organization: Not on file    Attends meetings of clubs or organizations: Not on file    Relationship status: Not on file  . Intimate partner violence:    Fear of current or ex partner: Not on file    Emotionally abused: Not on file    Physically abused: Not on file    Forced sexual activity: Not on file  Other Topics Concern  . Not on file  Social History Narrative  . Not on file    Family History  Problem Relation Age of Onset  . Stroke Maternal Grandfather   . Cancer Paternal Grandmother   . Cancer Paternal Grandfather      Review of Systems  Constitutional: Positive for chills, fever and malaise/fatigue.  HENT: Negative.  Negative for congestion, ear pain and sore  throat.   Eyes: Negative.  Negative for discharge and redness.  Respiratory: Negative.  Negative for cough and shortness of breath.   Cardiovascular: Negative.  Negative for chest pain and palpitations.  Gastrointestinal: Negative.  Negative for abdominal pain, blood in stool, diarrhea, nausea and vomiting.  Genitourinary: Negative.  Negative for dysuria, frequency and hematuria.  Musculoskeletal: Negative.   Neurological: Negative.  Negative for dizziness and headaches.  Endo/Heme/Allergies: Negative.   All other systems reviewed and are negative.   Vitals:   05/06/18 1429  BP: 124/84  Pulse: 88  Resp: 16  Temp: 99.2 F (37.3 C)  SpO2: 95%    Physical Exam Vitals signs reviewed.  Constitutional:      Appearance: Normal appearance.  HENT:     Head: Normocephalic and atraumatic.     Right Ear: Tympanic membrane, ear canal and external ear normal.       Left Ear: Tympanic membrane, ear canal and external ear normal.     Nose: Nose normal.     Mouth/Throat:     Mouth: Mucous membranes are moist.     Pharynx: Oropharynx is clear.  Eyes:     Extraocular Movements: Extraocular movements intact.     Conjunctiva/sclera: Conjunctivae normal.     Pupils: Pupils are equal, round, and reactive to light.  Neck:     Musculoskeletal: Normal range of motion and neck supple.  Cardiovascular:     Rate and Rhythm: Normal rate and regular rhythm.     Heart sounds: Normal heart sounds.  Pulmonary:     Effort: Pulmonary effort is normal.     Breath sounds: Normal breath sounds.  Abdominal:     General: Bowel sounds are normal. There is no distension.     Palpations: Abdomen is soft. There is no mass.     Tenderness: There is no abdominal tenderness.  Musculoskeletal: Normal range of motion.  Lymphadenopathy:     Cervical: No cervical adenopathy.  Skin:    General: Skin is warm and dry.     Capillary Refill: Capillary refill takes less than 2 seconds.  Neurological:     General: No focal deficit present.     Mental Status: He is alert and oriented to person, place, and time.  Psychiatric:        Mood and Affect: Mood normal.        Behavior: Behavior normal.     Results for orders placed or performed in visit on 05/06/18 (from the past 24 hour(s))  POCT CBC     Status: Abnormal   Collection Time: 05/06/18  2:58 PM  Result Value Ref Range   WBC 13.9 (A) 4.6 - 10.2 K/uL   Lymph, poc 6.8 (A) 0.6 - 3.4   POC LYMPH PERCENT 48.6 (A) 10 - 50 %L   MID (cbc) 1.2 (A) 0 - 0.9   POC MID % 8.4 0 - 12 %M   POC Granulocyte 6.0 2 - 6.9   Granulocyte percent 43.0 37 - 80 %G   RBC 4.81 4.69 - 6.13 M/uL   Hemoglobin 13.7 11 - 14.6 g/dL   HCT, POC 14.740.2 29 - 41 %   MCV 83.6 76 - 111 fL   MCH, POC 28.4 27 - 31.2 pg   MCHC 34.0 31.8 - 35.4 g/dL   RDW, POC 82.914.7 %   Platelet Count, POC 264 142 - 424 K/uL   MPV 6.7 0 - 99.8 fL  POCT urinalysis  dipstick  Status: None   Collection Time: 05/06/18  3:10 PM  Result Value Ref Range   Color, UA yellow yellow   Clarity, UA clear clear   Glucose, UA negative negative mg/dL   Bilirubin, UA negative negative   Ketones, POC UA negative negative mg/dL   Spec Grav, UA 1.610 9.604 - 1.025   Blood, UA negative negative   pH, UA 5.5 5.0 - 8.0   Protein Ur, POC negative negative mg/dL   Urobilinogen, UA 1.0 0.2 or 1.0 E.U./dL   Nitrite, UA Negative Negative   Leukocytes, UA Negative Negative    ASSESSMENT & PLAN: Fever, unspecified Clinically stable.  Low-grade fever.  No new symptoms.  Condition basically unchanged from 2 days ago when he was seen at this office.  CBC still shows a leukocytosis.  Mild elevation of liver enzymes.  Most likely a viral illness.  However urine showed bacteriuria.  Asymptomatic.  Will start doxycycline 100 mg twice a day and send urine culture today.  Will reassess in 1 week but earlier if condition changes.  ED instructions given.  [place] was seen today for follow-up.  Diagnoses and all orders for this visit:  Fever, unspecified -     POCT CBC -     POCT urinalysis dipstick -     Urine Culture -     Comprehensive metabolic panel  Other orders -     doxycycline (VIBRA-TABS) 100 MG tablet; Take 1 tablet (100 mg total) by mouth 2 (two) times daily for 7 days.    Patient Instructions       If you have lab work done today you will be contacted with your lab results within the next 2 weeks.  If you have not heard from [place] then please contact us. The fastest way to get your results is to register for My Chart.   IF you received an x-ray today, you will receive an invoice from [facility]. Please contact [facility] at [phone] with questions or concerns regarding your invoice.   IF you received labwork today, you will receive an invoice from [facility]. Please contact [facility] at [phone] with questions or concerns regarding your  invoice.   Our billing staff will not be able to assist you with questions regarding bills from these companies.  You will be contacted with the lab results as soon as they are available. The fastest way to get your results is to activate your My Chart account. Instructions are located on the last page of this paperwork. If you have not heard from [place] regarding the results in 2 weeks, please contact this office.     Viral Illness, Adult Viruses are tiny germs that can get into a person's body and cause illness. There are many different types of viruses, and they cause many types of illness. Viral illnesses can range from mild to severe. They can affect various parts of the body. Common illnesses that are caused by a virus include colds and the flu. Viral illnesses also include serious conditions such as HIV/AIDS (human immunodeficiency virus/acquired immunodeficiency syndrome). A few viruses have been linked to certain cancers. What are the causes? Many types of viruses can cause illness. Viruses invade cells in your body, multiply, and cause the infected cells to malfunction or die. When the cell dies, it releases more of the virus. When this happens, you develop symptoms of the illness, and the virus continues to spread to other cells. If the virus takes over the function of the  cell, it can cause the cell to divide and grow out of control, as is the case when a virus causes cancer. Different viruses get into the body in different ways. You can get a virus by:  Swallowing food or water that is contaminated with the virus.  Breathing in droplets that have been coughed or sneezed into the air by an infected person.  Touching a surface that has been contaminated with the virus and then touching your eyes, nose, or mouth.  Being bitten by an insect or animal that carries the virus.  Having sexual contact with a person who is infected with the virus.  Being exposed to blood or fluids that  contain the virus, either through an open cut or during a transfusion. If a virus enters your body, your body's defense system (immune system) will try to fight the virus. You may be at higher risk for a viral illness if your immune system is weak. What are the signs or symptoms? Symptoms vary depending on the type of virus and the location of the cells that it invades. Common symptoms of the main types of viral illnesses include: Cold and flu viruses  Fever.  Headache.  Sore throat.  Muscle aches.  Nasal congestion.  Cough. Digestive system (gastrointestinal) viruses  Fever.  Abdominal pain.  Nausea.  Diarrhea. Liver viruses (hepatitis)  Loss of appetite.  Tiredness.  Yellowing of the skin (jaundice). Brain and spinal cord viruses  Fever.  Headache.  Stiff neck.  Nausea and vomiting.  Confusion or sleepiness. Skin viruses  Warts.  Itching.  Rash. Sexually transmitted viruses  Discharge.  Swelling.  Redness.  Rash. How is this treated? Viruses can be difficult to treat because they live within cells. Antibiotic medicines do not treat viruses because these drugs do not get inside cells. Treatment for a viral illness may include:  Resting and drinking plenty of fluids.  Medicines to relieve symptoms. These can include over-the-counter medicine for pain and fever, medicines for cough or congestion, and medicines to relieve diarrhea.  Antiviral medicines. These drugs are available only for certain types of viruses. They may help reduce flu symptoms if taken early. There are also many antiviral medicines for hepatitis and HIV/AIDS. Some viral illnesses can be prevented with vaccinations. A common example is the flu shot. Follow these instructions at home: Medicines   Take over-the-counter and prescription medicines only as told by your health care provider.  If you were prescribed an antiviral medicine, take it as told by your health care  provider. Do not stop taking the medicine even if you start to feel better.  Be aware of when antibiotics are needed and when they are not needed. Antibiotics do not treat viruses. If your health care provider thinks that you may have a bacterial infection as well as a viral infection, you may get an antibiotic. ? Do not ask for an antibiotic prescription if you have been diagnosed with a viral illness. That will not make your illness go away faster. ? Frequently taking antibiotics when they are not needed can lead to antibiotic resistance. When this develops, the medicine no longer works against the bacteria that it normally fights. General instructions  Drink enough fluids to keep your urine clear or pale yellow.  Rest as much as possible.  Return to your normal activities as told by your health care provider. Ask your health care provider what activities are safe for you.  Keep all follow-up visits as told by your  health care provider. This is important. How is this prevented? Take these actions to reduce your risk of viral infection:  Eat a healthy diet and get enough rest.  Wash your hands often with soap and water. This is especially important when you are in public places. If soap and water are not available, use hand sanitizer.  Avoid close contact with friends and family who have a viral illness.  If you travel to areas where viral gastrointestinal infection is common, avoid drinking water or eating raw food.  Keep your immunizations up to date. Get a flu shot every year as told by your health care provider.  Do not share toothbrushes, nail clippers, razors, or needles with other people.  Always practice safe sex.  Contact a health care provider if:  You have symptoms of a viral illness that do not go away.  Your symptoms come back after going away.  Your symptoms get worse. Get help right away if:  You have trouble breathing.  You have a severe headache or a stiff  neck.  You have severe vomiting or abdominal pain. This information is not intended to replace advice given to you by your health care provider. Make sure you discuss any questions you have with your health care provider. Document Released: 08/20/2015 Document Revised: 09/22/2015 Document Reviewed: 08/20/2015 Elsevier Interactive Patient Education  2019 [employer]  Fever, Adult     A fever is an increase in your body's temperature. It often means a temperature of 100.74F (38C) or higher. Brief mild or moderate fevers often have no long-term effects. They often do not need treatment. Moderate or high fevers may make you feel uncomfortable. Sometimes, they can be a sign of a serious illness or disease. A fever that keeps coming back or that lasts a long time may cause you to lose water in your body (get dehydrated). You can take your temperature with a thermometer to see if you have a fever. Temperature can change with:  Age.  Time of day.  Where the thermometer is put in the body. Readings may vary when the thermometer is put: ? In the mouth (oral). ? In the butt (rectal). ? In the ear (tympanic). ? Under the arm (axillary). ? On the forehead (temporal). Follow these instructions at home: Medicines  Take over-the-counter and prescription medicines only as told by your doctor. Follow the dosing instructions carefully.  If you were prescribed an antibiotic medicine, take it as told by your doctor. Do not stop taking it even if you start to feel better. General instructions  Watch for any changes in your symptoms. Tell your doctor about them.  Rest as needed.  Drink enough fluid to keep your pee (urine) pale yellow.  Sponge yourself or bathe with room-temperature water as needed. This helps to lower your body temperature. Do not use ice water.  Do not use too many blankets or wear clothes that are too heavy.  If your fever was caused by an infection that spreads from person  to person (is contagious), such as a cold or the flu: ? You should stay home from work and public places for at least 24 hours after your fever is gone. ? Your fever should be gone for at least 24 hours without the need to use medicines. Contact a doctor if:  You throw up (vomit).  You cannot eat or drink without throwing up.  You have watery poop (diarrhea).  It hurts when you pee.  Your symptoms  do not get better with treatment.  You have new symptoms.  You feel very weak. Get help right away if:  You are short of breath or have trouble breathing.  You are dizzy or you pass out (faint).  You feel mixed up (confused).  You have signs of not having enough water in your body, such as: ? Dark pee, very little pee, or no pee. ? Cracked lips. ? Dry mouth. ? Sunken eyes. ? Sleepiness. ? Weakness.  You have very bad pain in your belly (abdomen).  You keep throwing up or having watery poop.  You have a rash on your skin.  Your symptoms get worse all of a sudden. Summary  A fever is an increase in your body's temperature. It often means a temperature of 100.73F (38C) or higher.  Watch for any changes in your symptoms. Tell your doctor about them.  Take all medicines only as told by your doctor.  Do not go to work or other public places if your fever was caused by an illness that can spread to other people.  Get help right away if you have signs that you do not have enough water in your body. This information is not intended to replace advice given to you by your health care provider. Make sure you discuss any questions you have with your health care provider. Document Released: 01/18/2008 Document Revised: 09/24/2017 Document Reviewed: 09/24/2017 Elsevier Interactive Patient Education  2019 [employer] Inc.      Edwina BarthMiguel Artemis Koller, MD [facility]

## 2018-05-07 ENCOUNTER — Encounter: Payer: Self-pay | Admitting: Radiology

## 2018-05-07 ENCOUNTER — Encounter: Payer: Self-pay | Admitting: Emergency Medicine

## 2018-05-07 DIAGNOSIS — R509 Fever, unspecified: Secondary | ICD-10-CM | POA: Insufficient documentation

## 2018-05-07 LAB — COMPREHENSIVE METABOLIC PANEL
ALT: 155 IU/L — ABNORMAL HIGH (ref 0–44)
AST: 68 IU/L — ABNORMAL HIGH (ref 0–40)
Albumin/Globulin Ratio: 1.1 — ABNORMAL LOW (ref 1.2–2.2)
Albumin: 3.5 g/dL (ref 3.5–5.5)
Alkaline Phosphatase: 75 IU/L (ref 39–117)
BUN/Creatinine Ratio: 10 (ref 9–20)
BUN: 8 mg/dL (ref 6–20)
Bilirubin Total: 0.7 mg/dL (ref 0.0–1.2)
CO2: 21 mmol/L (ref 20–29)
Calcium: 8.5 mg/dL — ABNORMAL LOW (ref 8.7–10.2)
Chloride: 103 mmol/L (ref 96–106)
Creatinine, Ser: 0.81 mg/dL (ref 0.76–1.27)
GFR calc Af Amer: 130 mL/min/{1.73_m2} (ref >59)
GFR calc non Af Amer: 113 mL/min/{1.73_m2} (ref >59)
Globulin, Total: 3.1 g/dL (ref 1.5–4.5)
Glucose: 75 mg/dL (ref 65–99)
Potassium: 3.9 mmol/L (ref 3.5–5.2)
Sodium: 137 mmol/L (ref 134–144)
Total Protein: 6.6 g/dL (ref 6.0–8.5)

## 2018-05-07 LAB — URINE CULTURE: ORGANISM ID, BACTERIA: NO GROWTH

## 2018-05-07 NOTE — Assessment & Plan Note (Signed)
Clinically stable.  Low-grade fever.  No new symptoms.  Condition basically unchanged from 2 days ago when he was seen at this office.  CBC still shows a leukocytosis.  Mild elevation of liver enzymes.  Most likely a viral illness.  However urine showed bacteriuria.  Asymptomatic.  Will start doxycycline 100 mg twice a day and send urine culture today.  Will reassess in 1 week but earlier if condition changes.  ED instructions given.

## 2018-05-09 LAB — PATHOLOGIST SMEAR REVIEW
Basophils Absolute: 0.2 10*3/uL (ref 0.0–0.2)
Basos: 1 %
EOS (ABSOLUTE): 0.2 10*3/uL (ref 0.0–0.4)
Eos: 2 %
Hematocrit: 43.5 % (ref 37.5–51.0)
Hemoglobin: 14.7 g/dL (ref 13.0–17.7)
Immature Grans (Abs): 0.2 10*3/uL — ABNORMAL HIGH (ref 0.0–0.1)
Immature Granulocytes: 1 %
Lymphocytes Absolute: 7 10*3/uL — ABNORMAL HIGH (ref 0.7–3.1)
Lymphs: 44 %
MCH: 28.5 pg (ref 26.6–33.0)
MCHC: 33.8 g/dL (ref 31.5–35.7)
MCV: 85 fL (ref 79–97)
Monocytes Absolute: 2.6 10*3/uL — ABNORMAL HIGH (ref 0.1–0.9)
Monocytes: 16 %
Neutrophils Absolute: 5.7 10*3/uL (ref 1.4–7.0)
Neutrophils: 36 %
Path Rev PLTs: NORMAL
Path Rev RBC: NORMAL
Platelets: 270 10*3/uL (ref 150–450)
RBC: 5.15 x10E6/uL (ref 4.14–5.80)
RDW: 13.8 % (ref 11.6–15.4)
WBC: 15.8 10*3/uL — ABNORMAL HIGH (ref 3.4–10.8)

## 2018-05-13 ENCOUNTER — Other Ambulatory Visit: Payer: Self-pay

## 2018-05-13 ENCOUNTER — Encounter: Payer: Self-pay | Admitting: Emergency Medicine

## 2018-05-13 ENCOUNTER — Ambulatory Visit: Payer: BC Managed Care – PPO | Admitting: Emergency Medicine

## 2018-05-13 VITALS — BP 123/76 | HR 70 | Temp 98.1°F | Resp 20 | Ht 68.11 in | Wt 188.4 lb

## 2018-05-13 DIAGNOSIS — R748 Abnormal levels of other serum enzymes: Secondary | ICD-10-CM

## 2018-05-13 DIAGNOSIS — B349 Viral infection, unspecified: Secondary | ICD-10-CM | POA: Diagnosis not present

## 2018-05-13 DIAGNOSIS — R509 Fever, unspecified: Secondary | ICD-10-CM

## 2018-05-13 DIAGNOSIS — D72829 Elevated white blood cell count, unspecified: Secondary | ICD-10-CM | POA: Diagnosis not present

## 2018-05-13 LAB — POCT CBC
GRANULOCYTE PERCENT: 38.3 % (ref 37–80)
HCT, POC: 44.4 % — AB (ref 29–41)
Hemoglobin: 15.8 g/dL — AB (ref 11–14.6)
Lymph, poc: 5.9 — AB (ref 0.6–3.4)
MCH: 29.5 pg (ref 27–31.2)
MCHC: 35.5 g/dL — AB (ref 31.8–35.4)
MCV: 83.3 fL (ref 76–111)
MID (CBC): 1.5 — AB (ref 0–0.9)
MPV: 7.1 fL (ref 0–99.8)
POC Granulocyte: 4.6 (ref 2–6.9)
POC LYMPH PERCENT: 49.5 %L (ref 10–50)
POC MID %: 12.2 %M — AB (ref 0–12)
Platelet Count, POC: 327 10*3/uL (ref 142–424)
RBC: 5.33 M/uL (ref 4.69–6.13)
RDW, POC: 15.2 %
WBC: 12 10*3/uL — AB (ref 4.6–10.2)

## 2018-05-13 NOTE — Assessment & Plan Note (Addendum)
Fever gone.  Asymptomatic.  Clinically much improved.  Stable vital signs.  Leukocytosis still present but less than before.  No further treatment necessary at this time.  Will monitor your symptoms and follow-up as needed.

## 2018-05-13 NOTE — Progress Notes (Signed)
Brian Simon 38 y.o.   Chief Complaint  Patient presents with  . Follow-up    fever- pt would like blood work results    HISTORY OF PRESENT ILLNESS: This is a 38 y.o. male here for follow-up of fever, leukocytosis, and elevated liver enzymes.  Doing much better.  Fever resolved now.  Seen by me on 05/06/2018.  Started on doxycycline twice a day due to bacteriuria.  Urine culture came back negative.  Asymptomatic today.  HPI   Prior to Admission medications   Medication Sig Start Date End Date Taking? Authorizing Provider  doxycycline (VIBRA-TABS) 100 MG tablet Take 1 tablet (100 mg total) by mouth 2 (two) times daily for 7 days. 05/06/18 05/13/18 Yes Clevester Helzer, Eilleen Kempf, MD  ibuprofen (ADVIL,MOTRIN) 200 MG tablet Take 200 mg by mouth every 6 (six) hours as needed.   Yes [provider]    No Known Allergies  Patient Active Problem List   Diagnosis Date Noted  . Fever, unspecified 05/07/2018    Past Medical History:  Diagnosis Date  . Neuromuscular disorder [facility])     Past Surgical History:  Procedure Laterality Date  . SPINE SURGERY      Social History   Socioeconomic History  . Marital status: Significant Other    Spouse name: Not on file  . Number of children: 1  . Years of education: Not on file  . Highest education level: Not on file  Occupational History  . Not on file  Social Needs  . Financial resource strain: Not on file  . Food insecurity:    Worry: Not on file    Inability: Not on file  . Transportation needs:    Medical: Not on file    Non-medical: Not on file  Tobacco Use  . Smoking status: Never Smoker  . Smokeless tobacco: Never Used  Substance and Sexual Activity  . Alcohol use: Yes    Alcohol/week: 0.0 standard drinks  . Drug use: No  . Sexual activity: Yes  Lifestyle  . Physical activity:    Days per week: Not on file    Minutes per session: Not on file  . Stress: Not on file  Relationships  . Social connections:   Talks on phone: Not on file    Gets together: Not on file    Attends religious service: Not on file    Active member of club or organization: Not on file    Attends meetings of clubs or organizations: Not on file    Relationship status: Not on file  . Intimate partner violence:    Fear of current or ex partner: Not on file    Emotionally abused: Not on file    Physically abused: Not on file    Forced sexual activity: Not on file  Other Topics Concern  . Not on file  Social History Narrative  . Not on file    Family History  Problem Relation Age of Onset  . Stroke Maternal Grandfather   . Cancer Paternal Grandmother   . Cancer Paternal Grandfather      Review of Systems  Constitutional: Negative.  Negative for chills, fever and malaise/fatigue.  HENT: Negative.  Negative for sore throat.   Eyes: Negative.  Negative for blurred vision and double vision.  Respiratory: Negative.  Negative for cough and shortness of breath.   Cardiovascular: Negative.  Negative for chest pain and palpitations.  Gastrointestinal: Positive for abdominal pain (Had one episode of left upper quadrant  pain several days ago that lasted 1 hour and then went away.  No recurrence.). Negative for blood in stool, diarrhea, heartburn, melena, nausea and vomiting.  Genitourinary: Negative.  Negative for dysuria and hematuria.  Musculoskeletal: Negative.  Negative for back pain, myalgias and neck pain.  Skin: Negative.  Negative for rash.  Neurological: Negative.  Negative for dizziness and headaches.  Endo/Heme/Allergies: Negative.     Vitals:   05/13/18 1132  BP: 123/76  Pulse: 70  Resp: 20  Temp: 98.1 F (36.7 C)  SpO2: 98%    Physical Exam Vitals signs reviewed.  Constitutional:      Appearance: Normal appearance.  HENT:     Head: Normocephalic and atraumatic.     Nose: Nose normal.     Mouth/Throat:     Mouth: Mucous membranes are moist.     Pharynx: Oropharynx is clear.  Eyes:      Conjunctiva/sclera: Conjunctivae normal.     Pupils: Pupils are equal, round, and reactive to light.  Neck:     Musculoskeletal: Normal range of motion and neck supple.  Cardiovascular:     Rate and Rhythm: Normal rate and regular rhythm.     Heart sounds: Normal heart sounds.  Pulmonary:     Effort: Pulmonary effort is normal.     Breath sounds: Normal breath sounds.  Abdominal:     Palpations: Abdomen is soft.     Tenderness: There is no abdominal tenderness.  Musculoskeletal: Normal range of motion.  Skin:    General: Skin is warm and dry.     Capillary Refill: Capillary refill takes less than 2 seconds.  Neurological:     General: No focal deficit present.     Mental Status: He is alert and oriented to person, place, and time.  Psychiatric:        Mood and Affect: Mood normal.        Behavior: Behavior normal.    Results for orders placed or performed in visit on 05/13/18 (from the past 24 hour(s))  POCT CBC     Status: Abnormal   Collection Time: 05/13/18 12:41 PM  Result Value Ref Range   WBC 12.0 (A) 4.6 - 10.2 K/uL   Lymph, poc 5.9 (A) 0.6 - 3.4   POC LYMPH PERCENT 49.5 10 - 50 %L   MID (cbc) 1.5 (A) 0 - 0.9   POC MID % 12.2 (A) 0 - 12 %M   POC Granulocyte 4.6 2 - 6.9   Granulocyte percent 38.3 37 - 80 %G   RBC 5.33 4.69 - 6.13 M/uL   Hemoglobin 15.8 (A) 11 - 14.6 g/dL   HCT, POC 40.944.4 (A) 29 - 41 %   MCV 83.3 76 - 111 fL   MCH, POC 29.5 27 - 31.2 pg   MCHC 35.5 (A) 31.8 - 35.4 g/dL   RDW, POC 81.115.2 %   Platelet Count, POC 327 142 - 424 K/uL   MPV 7.1 0 - 99.8 fL    A total of 25 minutes was spent in the room with the patient, greater than 50% of which was in counseling/coordination of care regarding diagnosis, review of blood results, treatment, medications and need for follow-up if no better or worse.  ASSESSMENT & PLAN: Fever, unspecified Fever gone.  Asymptomatic.  Clinically much improved.  Stable vital signs.  Leukocytosis still present but less than  before.  No further treatment necessary at this time.  Will monitor your symptoms and follow-up as needed.  Alycia RossettiRyan was seen today for follow-up.  Diagnoses and all orders for this visit:  Fever, unspecified  Viral syndrome  Leukocytosis, unspecified type -     POCT CBC  Elevated liver enzymes -     Comprehensive metabolic panel    Patient Instructions  Viral Illness, Adult Viruses are tiny germs that can get into a person's body and cause illness. There are many different types of viruses, and they cause many types of illness. Viral illnesses can range from mild to severe. They can affect various parts of the body. Common illnesses that are caused by a virus include colds and the flu. Viral illnesses also include serious conditions such as HIV/AIDS (human immunodeficiency virus/acquired immunodeficiency syndrome). A few viruses have been linked to certain cancers. What are the causes? Many types of viruses can cause illness. Viruses invade cells in your body, multiply, and cause the infected cells to malfunction or die. When the cell dies, it releases more of the virus. When this happens, you develop symptoms of the illness, and the virus continues to spread to other cells. If the virus takes over the function of the cell, it can cause the cell to divide and grow out of control, as is the case when a virus causes cancer. Different viruses get into the body in different ways. You can get a virus by:  Swallowing food or water that is contaminated with the virus.  Breathing in droplets that have been coughed or sneezed into the air by an infected person.  Touching a surface that has been contaminated with the virus and then touching your eyes, nose, or mouth.  Being bitten by an insect or animal that carries the virus.  Having sexual contact with a person who is infected with the virus.  Being exposed to blood or fluids that contain the virus, either through an open cut or during a  transfusion. If a virus enters your body, your body's defense system (immune system) will try to fight the virus. You may be at higher risk for a viral illness if your immune system is weak. What are the signs or symptoms? Symptoms vary depending on the type of virus and the location of the cells that it invades. Common symptoms of the main types of viral illnesses include: Cold and flu viruses  Fever.  Headache.  Sore throat.  Muscle aches.  Nasal congestion.  Cough. Digestive system (gastrointestinal) viruses  Fever.  Abdominal pain.  Nausea.  Diarrhea. Liver viruses (hepatitis)  Loss of appetite.  Tiredness.  Yellowing of the skin (jaundice). Brain and spinal cord viruses  Fever.  Headache.  Stiff neck.  Nausea and vomiting.  Confusion or sleepiness. Skin viruses  Warts.  Itching.  Rash. Sexually transmitted viruses  Discharge.  Swelling.  Redness.  Rash. How is this treated? Viruses can be difficult to treat because they live within cells. Antibiotic medicines do not treat viruses because these drugs do not get inside cells. Treatment for a viral illness may include:  Resting and drinking plenty of fluids.  Medicines to relieve symptoms. These can include over-the-counter medicine for pain and fever, medicines for cough or congestion, and medicines to relieve diarrhea.  Antiviral medicines. These drugs are available only for certain types of viruses. They may help reduce flu symptoms if taken early. There are also many antiviral medicines for hepatitis and HIV/AIDS. Some viral illnesses can be prevented with vaccinations. A common example is the flu shot. Follow these instructions at home: Medicines  Take over-the-counter and prescription medicines only as told by your health care provider.  If you were prescribed an antiviral medicine, take it as told by your health care provider. Do not stop taking the medicine even if you start to  feel better.  Be aware of when antibiotics are needed and when they are not needed. Antibiotics do not treat viruses. If your health care provider thinks that you may have a bacterial infection as well as a viral infection, you may get an antibiotic. ? Do not ask for an antibiotic prescription if you have been diagnosed with a viral illness. That will not make your illness go away faster. ? Frequently taking antibiotics when they are not needed can lead to antibiotic resistance. When this develops, the medicine no longer works against the bacteria that it normally fights. General instructions  Drink enough fluids to keep your urine clear or pale yellow.  Rest as much as possible.  Return to your normal activities as told by your health care provider. Ask your health care provider what activities are safe for you.  Keep all follow-up visits as told by your health care provider. This is important. How is this prevented? Take these actions to reduce your risk of viral infection:  Eat a healthy diet and get enough rest.  Wash your hands often with soap and water. This is especially important when you are in public places. If soap and water are not available, use hand sanitizer.  Avoid close contact with friends and family who have a viral illness.  If you travel to areas where viral gastrointestinal infection is common, avoid drinking water or eating raw food.  Keep your immunizations up to date. Get a flu shot every year as told by your health care provider.  Do not share toothbrushes, nail clippers, razors, or needles with other people.  Always practice safe sex.  Contact a health care provider if:  You have symptoms of a viral illness that do not go away.  Your symptoms come back after going away.  Your symptoms get worse. Get help right away if:  You have trouble breathing.  You have a severe headache or a stiff neck.  You have severe vomiting or abdominal pain. This  information is not intended to replace advice given to you by your health care provider. Make sure you discuss any questions you have with your health care provider. Document Released: 08/20/2015 Document Revised: 09/22/2015 Document Reviewed: 08/20/2015 Elsevier Interactive Patient Education  2019 [employer]      Edwina Barth, MD [facility]

## 2018-05-13 NOTE — Patient Instructions (Signed)
Viral Illness, Adult °Viruses are tiny germs that can get into a person's body and cause illness. There are many different types of viruses, and they cause many types of illness. Viral illnesses can range from mild to severe. They can affect various parts of the body. °Common illnesses that are caused by a virus include colds and the flu. Viral illnesses also include serious conditions such as HIV/AIDS (human immunodeficiency virus/acquired immunodeficiency syndrome). A few viruses have been linked to certain cancers. °What are the causes? °Many types of viruses can cause illness. Viruses invade cells in your body, multiply, and cause the infected cells to malfunction or die. When the cell dies, it releases more of the virus. When this happens, you develop symptoms of the illness, and the virus continues to spread to other cells. If the virus takes over the function of the cell, it can cause the cell to divide and grow out of control, as is the case when a virus causes cancer. °Different viruses get into the body in different ways. You can get a virus by: °· Swallowing food or water that is contaminated with the virus. °· Breathing in droplets that have been coughed or sneezed into the air by an infected person. °· Touching a surface that has been contaminated with the virus and then touching your eyes, nose, or mouth. °· Being bitten by an insect or animal that carries the virus. °· Having sexual contact with a person who is infected with the virus. °· Being exposed to blood or fluids that contain the virus, either through an open cut or during a transfusion. °If a virus enters your body, your body's defense system (immune system) will try to fight the virus. You may be at higher risk for a viral illness if your immune system is weak. °What are the signs or symptoms? °Symptoms vary depending on the type of virus and the location of the cells that it invades. Common symptoms of the main types of viral illnesses  include: °Cold and flu viruses °· Fever. °· Headache. °· Sore throat. °· Muscle aches. °· Nasal congestion. °· Cough. °Digestive system (gastrointestinal) viruses °· Fever. °· Abdominal pain. °· Nausea. °· Diarrhea. °Liver viruses (hepatitis) °· Loss of appetite. °· Tiredness. °· Yellowing of the skin (jaundice). °Brain and spinal cord viruses °· Fever. °· Headache. °· Stiff neck. °· Nausea and vomiting. °· Confusion or sleepiness. °Skin viruses °· Warts. °· Itching. °· Rash. °Sexually transmitted viruses °· Discharge. °· Swelling. °· Redness. °· Rash. °How is this treated? °Viruses can be difficult to treat because they live within cells. Antibiotic medicines do not treat viruses because these drugs do not get inside cells. Treatment for a viral illness may include: °· Resting and drinking plenty of fluids. °· Medicines to relieve symptoms. These can include over-the-counter medicine for pain and fever, medicines for cough or congestion, and medicines to relieve diarrhea. °· Antiviral medicines. These drugs are available only for certain types of viruses. They may help reduce flu symptoms if taken early. There are also many antiviral medicines for hepatitis and HIV/AIDS. °Some viral illnesses can be prevented with vaccinations. A common example is the flu shot. °Follow these instructions at home: °Medicines ° °· Take over-the-counter and prescription medicines only as told by your health care provider. °· If you were prescribed an antiviral medicine, take it as told by your health care provider. Do not stop taking the medicine even if you start to feel better. °· Be aware of when   antibiotics are needed and when they are not needed. Antibiotics do not treat viruses. If your health care provider thinks that you may have a bacterial infection as well as a viral infection, you may get an antibiotic. °? Do not ask for an antibiotic prescription if you have been diagnosed with a viral illness. That will not make your  illness go away faster. °? Frequently taking antibiotics when they are not needed can lead to antibiotic resistance. When this develops, the medicine no longer works against the bacteria that it normally fights. °General instructions °· Drink enough fluids to keep your urine clear or pale yellow. °· Rest as much as possible. °· Return to your normal activities as told by your health care provider. Ask your health care provider what activities are safe for you. °· Keep all follow-up visits as told by your health care provider. This is important. °How is this prevented? °Take these actions to reduce your risk of viral infection: °· Eat a healthy diet and get enough rest. °· Wash your hands often with soap and water. This is especially important when you are in public places. If soap and water are not available, use hand sanitizer. °· Avoid close contact with friends and family who have a viral illness. °· If you travel to areas where viral gastrointestinal infection is common, avoid drinking water or eating raw food. °· Keep your immunizations up to date. Get a flu shot every year as told by your health care provider. °· Do not share toothbrushes, nail clippers, razors, or needles with other people. °· Always practice safe sex. ° °Contact a health care provider if: °· You have symptoms of a viral illness that do not go away. °· Your symptoms come back after going away. °· Your symptoms get worse. °Get help right away if: °· You have trouble breathing. °· You have a severe headache or a stiff neck. °· You have severe vomiting or abdominal pain. °This information is not intended to replace advice given to you by your health care provider. Make sure you discuss any questions you have with your health care provider. °Document Released: 08/20/2015 Document Revised: 09/22/2015 Document Reviewed: 08/20/2015 °Elsevier Interactive Patient Education © 2019 [employer]vier Inc. ° °

## 2018-05-14 LAB — COMPREHENSIVE METABOLIC PANEL
A/G RATIO: 1.3 (ref 1.2–2.2)
ALT: 106 IU/L — ABNORMAL HIGH (ref 0–44)
AST: 35 IU/L (ref 0–40)
Albumin: 4.2 g/dL (ref 4.0–5.0)
Alkaline Phosphatase: 74 IU/L (ref 39–117)
BUN/Creatinine Ratio: 18 (ref 9–20)
BUN: 16 mg/dL (ref 6–20)
Bilirubin Total: 0.6 mg/dL (ref 0.0–1.2)
CO2: 21 mmol/L (ref 20–29)
Calcium: 9.5 mg/dL (ref 8.7–10.2)
Chloride: 106 mmol/L (ref 96–106)
Creatinine, Ser: 0.9 mg/dL (ref 0.76–1.27)
GFR calc non Af Amer: 108 mL/min/{1.73_m2} (ref >59)
GFR, EST AFRICAN AMERICAN: 125 mL/min/{1.73_m2} (ref >59)
Globulin, Total: 3.3 g/dL (ref 1.5–4.5)
Glucose: 82 mg/dL (ref 65–99)
Potassium: 4.6 mmol/L (ref 3.5–5.2)
Sodium: 141 mmol/L (ref 134–144)
Total Protein: 7.5 g/dL (ref 6.0–8.5)

## 2020-05-02 IMAGING — DX DG CHEST 2V
2 series · 2 of 2 positions shown · non-contrast
Comparison: None.

CLINICAL DATA: Fever for 1 week.  Leukocytosis.

EXAM:
CHEST - 2 VIEW

[chest pa]
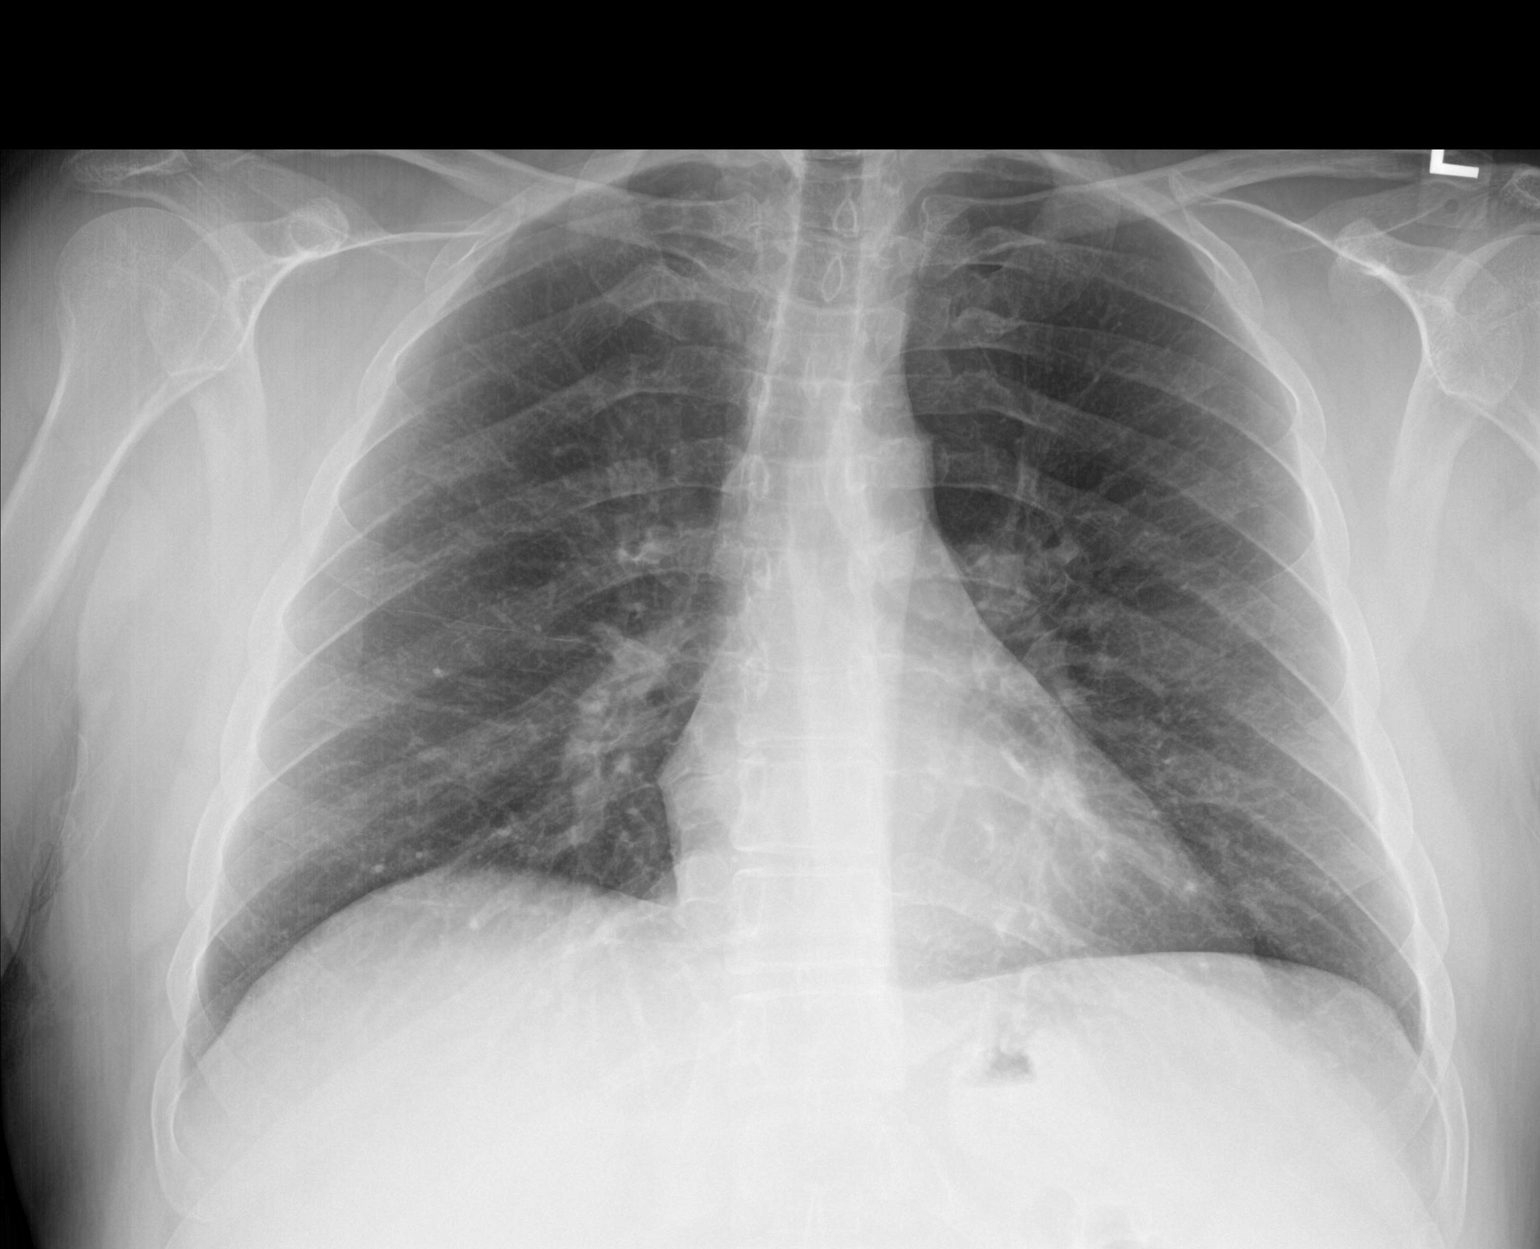

[chest lat]
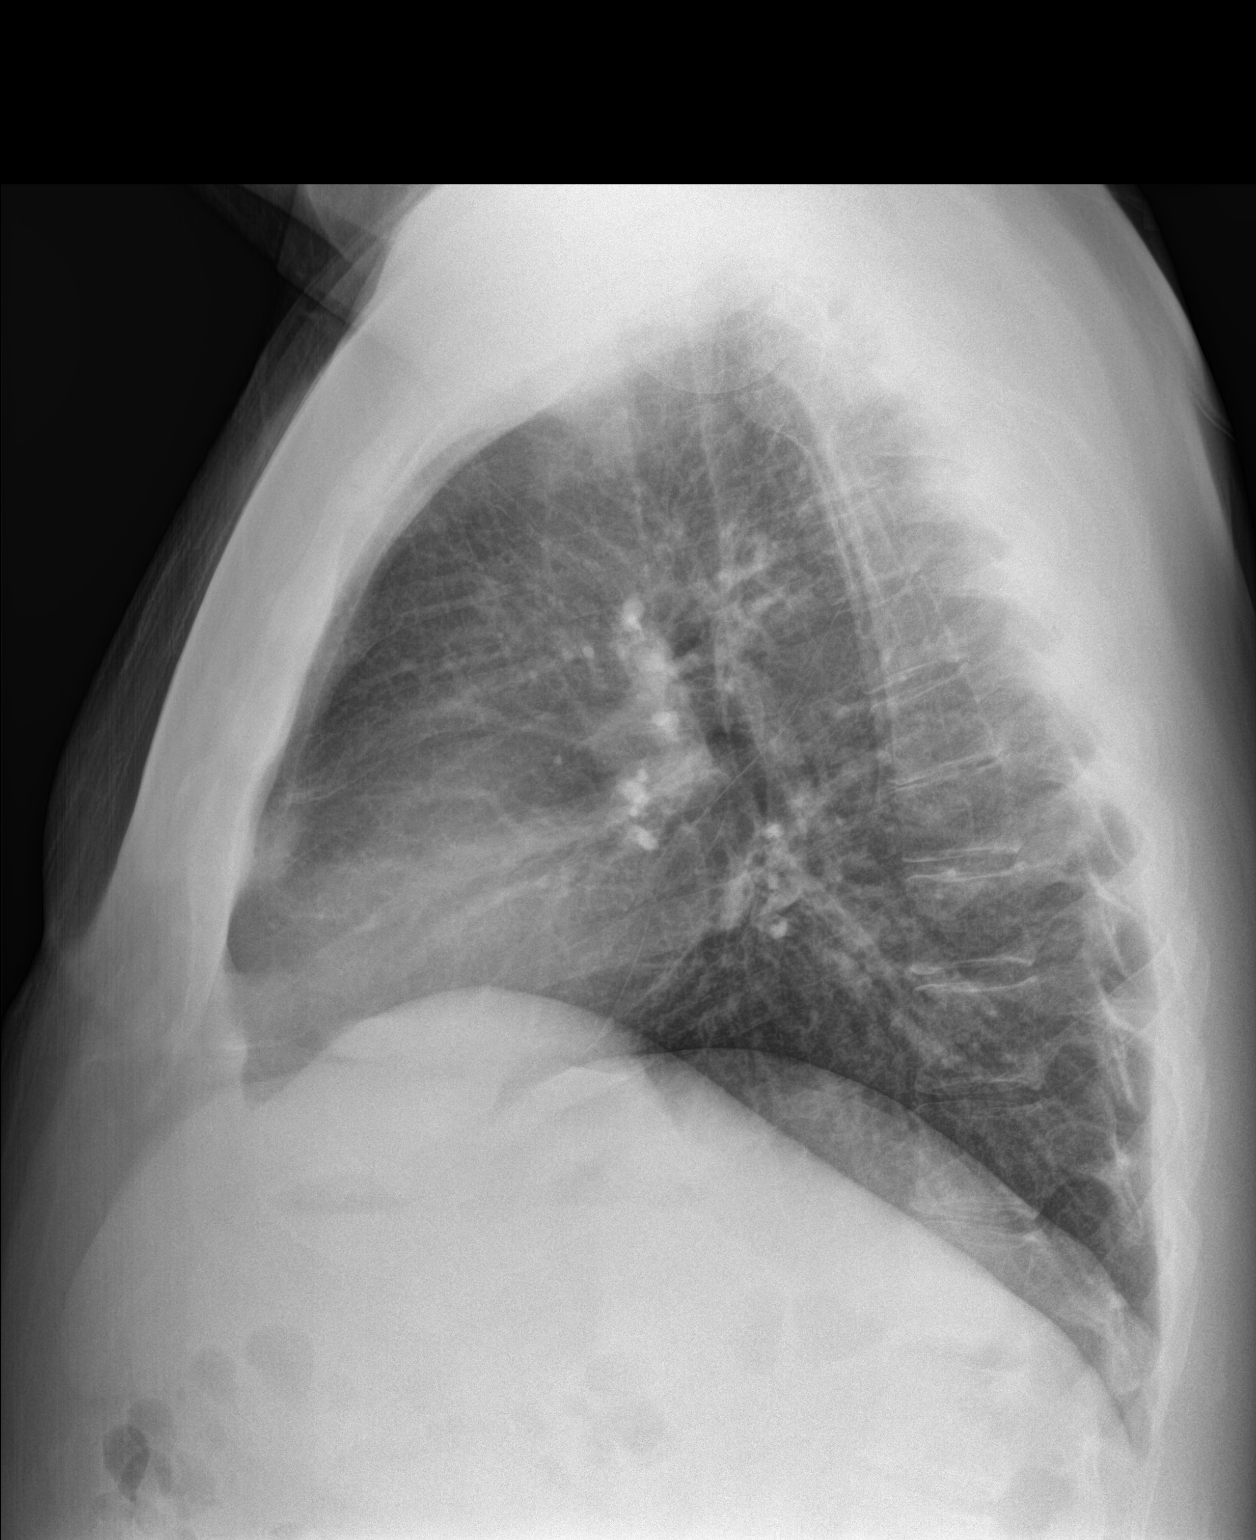

[2 of 2 positions shown; findings below may reference images not displayed]

FINDINGS: The heart size and mediastinal contours are within normal limits.
Both lungs are clear. The visualized skeletal structures are
unremarkable.
IMPRESSION: No active cardiopulmonary disease.

## 2020-10-06 ENCOUNTER — Other Ambulatory Visit: Payer: Self-pay

## 2020-10-06 ENCOUNTER — Emergency Department (HOSPITAL_COMMUNITY)
Admission: EM | Admit: 2020-10-06 | Discharge: 2020-10-07 | Disposition: A | Discharge status: Home / Self Care | Payer: Worker's Compensation | Attending: Emergency Medicine | Admitting: Emergency Medicine

## 2020-10-06 ENCOUNTER — Emergency Department (HOSPITAL_COMMUNITY): Payer: Worker's Compensation

## 2020-10-06 DIAGNOSIS — R42 Dizziness and giddiness: Secondary | ICD-10-CM | POA: Diagnosis not present

## 2020-10-06 DIAGNOSIS — T675XXA Heat exhaustion, unspecified, initial encounter: Secondary | ICD-10-CM | POA: Insufficient documentation

## 2020-10-06 DIAGNOSIS — T679XXA Effect of heat and light, unspecified, initial encounter: Secondary | ICD-10-CM

## 2020-10-06 DIAGNOSIS — X30XXXA Exposure to excessive natural heat, initial encounter: Secondary | ICD-10-CM | POA: Insufficient documentation

## 2020-10-06 DIAGNOSIS — R509 Fever, unspecified: Secondary | ICD-10-CM | POA: Diagnosis not present

## 2020-10-06 DIAGNOSIS — Z20822 Contact with and (suspected) exposure to covid-19: Secondary | ICD-10-CM | POA: Insufficient documentation

## 2020-10-06 LAB — URINALYSIS, ROUTINE W REFLEX MICROSCOPIC
Bilirubin Urine: NEGATIVE
Glucose, UA: NEGATIVE mg/dL
Hgb urine dipstick: NEGATIVE
Ketones, ur: 5 mg/dL — AB
Leukocytes,Ua: NEGATIVE
Nitrite: NEGATIVE
Protein, ur: NEGATIVE mg/dL
Specific Gravity, Urine: 1.018 (ref 1.005–1.030)
pH: 6 (ref 5.0–8.0)

## 2020-10-06 LAB — COMPREHENSIVE METABOLIC PANEL
ALT: 38 U/L (ref 0–44)
AST: 25 U/L (ref 15–41)
Albumin: 3.8 g/dL (ref 3.5–5.0)
Alkaline Phosphatase: 64 U/L (ref 38–126)
Anion gap: 9 (ref 5–15)
BUN: 16 mg/dL (ref 6–20)
CO2: 25 mmol/L (ref 22–32)
Calcium: 8.9 mg/dL (ref 8.9–10.3)
Chloride: 102 mmol/L (ref 98–111)
Creatinine, Ser: 1.08 mg/dL (ref 0.61–1.24)
GFR, Estimated: >60 mL/min (ref >60)
Glucose, Bld: 108 mg/dL — ABNORMAL HIGH (ref 70–99)
Potassium: 3.5 mmol/L (ref 3.5–5.1)
Sodium: 136 mmol/L (ref 135–145)
Total Bilirubin: 0.9 mg/dL (ref 0.3–1.2)
Total Protein: 7.1 g/dL (ref 6.5–8.1)

## 2020-10-06 LAB — CBC WITH DIFFERENTIAL/PLATELET
Abs Immature Granulocytes: 0.11 10*3/uL — ABNORMAL HIGH (ref 0.00–0.07)
Basophils Absolute: 0.1 10*3/uL (ref 0.0–0.1)
Basophils Relative: 0 %
Eosinophils Absolute: 0.1 10*3/uL (ref 0.0–0.5)
Eosinophils Relative: 1 %
HCT: 47.1 % (ref 39.0–52.0)
Hemoglobin: 16.1 g/dL (ref 13.0–17.0)
Immature Granulocytes: 1 %
Lymphocytes Relative: 13 %
Lymphs Abs: 1.8 10*3/uL (ref 0.7–4.0)
MCH: 31.1 pg (ref 26.0–34.0)
MCHC: 34.2 g/dL (ref 30.0–36.0)
MCV: 90.9 fL (ref 80.0–100.0)
Monocytes Absolute: 1.4 10*3/uL — ABNORMAL HIGH (ref 0.1–1.0)
Monocytes Relative: 10 %
Neutro Abs: 10.3 10*3/uL — ABNORMAL HIGH (ref 1.7–7.7)
Neutrophils Relative %: 75 %
Platelets: 227 10*3/uL (ref 150–400)
RBC: 5.18 MIL/uL (ref 4.22–5.81)
RDW: 13.6 % (ref 11.5–15.5)
WBC: 13.8 10*3/uL — ABNORMAL HIGH (ref 4.0–10.5)
nRBC: 0 % (ref 0.0–0.2)

## 2020-10-06 LAB — CK: Total CK: 119 U/L (ref 49–397)

## 2020-10-06 LAB — RESP PANEL BY RT-PCR (FLU A&B, COVID) ARPGX2
Influenza A by PCR: NEGATIVE
Influenza B by PCR: NEGATIVE
SARS Coronavirus 2 by RT PCR: NEGATIVE

## 2020-10-06 LAB — APTT: aPTT: 27 seconds (ref 24–36)

## 2020-10-06 LAB — PROTIME-INR
INR: 1 (ref 0.8–1.2)
Prothrombin Time: 13 seconds (ref 11.4–15.2)

## 2020-10-06 LAB — LACTIC ACID, PLASMA: Lactic Acid, Venous: 1.1 mmol/L (ref 0.5–1.9)

## 2020-10-06 MED ORDER — ACETAMINOPHEN 325 MG PO TABS
650.0000 mg | ORAL_TABLET | Freq: Once | ORAL | Status: AC
  Administered 2020-10-06: 650 mg via ORAL
  Filled 2020-10-06: qty 2

## 2020-10-06 NOTE — ED Provider Notes (Signed)
Emergency Medicine Provider Triage Evaluation Note  Brian Simon , a 40 y.o. male  was evaluated in triage.  Pt complains of fever, chills, and headache that started this afternoon after working outside. Patient notes his desk is outside. No symptoms prior to today. Denies cough, nausea, vomiting, diarrhea. No sick contacts or known COVID exposures. Admits to mild rhinorrhea. Denies dizziness or syncopal episodes.  Review of Systems  Positive: Fever, chills Negative: CP  Physical Exam  BP (!) 148/97 (BP Location: Right Arm)   Pulse (!) 113   Temp (!) 101.2 F (38.4 C) (Oral)   Resp 18   SpO2 97%  Gen:   Awake, no distress   Resp:  Normal effort  MSK:   Moves extremities without difficulty  Other:    Medical Decision Making  Medically screening exam initiated at 8:20 PM.  Appropriate orders placed.  Brian Simon was informed that the remainder of the evaluation will be completed by another provider, this initial triage assessment does not replace that evaluation, and the importance of remaining in the ED until their evaluation is complete.  Fever after being outside. Difficult to tell if heat exhaustion vs. Infection. Sepsis screening labs ordered. Normal mentation. Low suspicion for head stroke.    Jesusita Oka 10/06/20 2023    Linwood Dibbles, MD 10/07/20 [phone]

## 2020-10-06 NOTE — ED Triage Notes (Signed)
Pt reports that he works outside and that he feels hot and has been unable to cool down. States he drank 8 or 9 bottles of water today, when he got home his temp was 100.9. Febrile in triage. A&Ox4

## 2020-10-07 ENCOUNTER — Encounter (HOSPITAL_COMMUNITY): Payer: Self-pay | Admitting: Student

## 2020-10-07 MED ORDER — SODIUM CHLORIDE 0.9 % IV BOLUS
1000.0000 mL | Freq: Once | INTRAVENOUS | Status: AC
  Administered 2020-10-07: 1000 mL via INTRAVENOUS

## 2020-10-07 NOTE — ED Provider Notes (Signed)
[facility] EMERGENCY DEPARTMENT Provider Note   CSN: [id number] Arrival date & time: 10/06/20  2008     History Chief Complaint  Patient presents with   Heat Exposure    Brian Simon is a 40 y.o. male with a hx of neurofibromatosis who presents to the ED with concern for heat exhaustion today. Patient states that he was outside today for work as he typically is but was feeling very overheated throughout the day. He developed a gradual onset headache similar to prior, felt fatigued, lightheaded, and went inside. He felt like the [facility] was burning his skin. Checked him temp and it was elevated @ 100.9, rechecked and it was 101 prompting ED visit. Feeling much better after waiting in the ED in the [facility]. Currently feels well. No other alleviating/aggravating factors. Denies chest pain, dyspnea, syncope, vomiting, diarrhea, or abdominal pain.   HPI     Past Medical History:  Diagnosis Date   Neuromuscular disorder [facility])     Patient Active Problem List   Diagnosis Date Noted   Fever, unspecified 05/07/2018    Past Surgical History:  Procedure Laterality Date   SPINE SURGERY         Family History  Problem Relation Age of Onset   Stroke Maternal Grandfather    Cancer Paternal Grandmother    Cancer Paternal Grandfather     Social History   Tobacco Use   Smoking status: Never   Smokeless tobacco: Never  Vaping Use   Vaping Use: Never used  Substance Use Topics   Alcohol use: Yes    Alcohol/week: 0.0 standard drinks   Drug use: No    Home Medications Prior to Admission medications   Medication Sig Start Date End Date Taking? Authorizing Provider  ibuprofen (ADVIL,MOTRIN) 200 MG tablet Take 200 mg by mouth every 6 (six) hours as needed.    [provider]    Allergies    Patient has no known allergies.  Review of Systems   Review of Systems  Constitutional:  Positive for fatigue. Negative for chills and fever.  Eyes:  Negative for  visual disturbance.  Respiratory:  Negative for shortness of breath.   Cardiovascular:  Negative for chest pain.  Gastrointestinal:  Negative for abdominal pain, diarrhea and vomiting.  Genitourinary:  Negative for dysuria.  Neurological:  Positive for light-headedness and headaches. Negative for syncope, weakness (focal) and numbness.  All other systems reviewed and are negative.  Physical Exam Updated Vital Signs BP (!) 130/91   Pulse 81   Temp 98.7 F (37.1 C) (Oral)   Resp 15   SpO2 98%   Physical Exam Vitals and nursing note reviewed.  Constitutional:      General: He is not in acute distress.    Appearance: He is well-developed. He is not toxic-appearing.  HENT:     Head: Normocephalic and atraumatic.  Eyes:     General:        Right eye: No discharge.        Left eye: No discharge.     Conjunctiva/sclera: Conjunctivae normal.  Cardiovascular:     Rate and Rhythm: Normal rate and regular rhythm.  Pulmonary:     Effort: Pulmonary effort is normal. No respiratory distress.     Breath sounds: Normal breath sounds. No wheezing, rhonchi or rales.  Abdominal:     General: There is no distension.     Palpations: Abdomen is soft.     Tenderness: There is no  abdominal tenderness.  Musculoskeletal:     Cervical back: Neck supple. No rigidity.  Skin:    General: Skin is warm and dry.     Findings: No rash.  Neurological:     Mental Status: He is alert.     Comments: Alert. Clear speech. No facial droop. CNIII-XII grossly intact. Bilateral upper and lower extremities' sensation grossly intact. 5/5 symmetric strength with grip strength and with plantar and dorsi flexion bilaterally. Normal finger to nose. Negative pronator drift.   Psychiatric:        Behavior: Behavior normal.    ED Results / Procedures / Treatments   Labs (all labs ordered are listed, but only abnormal results are displayed) Labs Reviewed  COMPREHENSIVE METABOLIC PANEL - Abnormal; Notable for the  following components:      Result Value   Glucose, Bld 108 (*)    All other components within normal limits  CBC WITH DIFFERENTIAL/PLATELET - Abnormal; Notable for the following components:   WBC 13.8 (*)    Neutro Abs 10.3 (*)    Monocytes Absolute 1.4 (*)    Abs Immature Granulocytes 0.11 (*)    All other components within normal limits  URINALYSIS, ROUTINE W REFLEX MICROSCOPIC - Abnormal; Notable for the following components:   Ketones, ur 5 (*)    All other components within normal limits  RESP PANEL BY RT-PCR (FLU A&B, COVID) ARPGX2  CULTURE, BLOOD (SINGLE)  URINE CULTURE  LACTIC ACID, PLASMA  CK  PROTIME-INR  APTT    EKG None  Radiology DG Chest Port 1 View  Result Date: 10/06/2020 CLINICAL DATA:  Questionable sepsis EXAM: PORTABLE CHEST 1 VIEW COMPARISON:  05/04/2018 FINDINGS: Heart and mediastinal contours are within normal limits. No focal opacities or effusions. No acute bony abnormality. IMPRESSION: No active disease. Electronically Signed   By: Charlett Nose M.D.   On: 10/06/2020 21:20    Procedures Procedures   Medications Ordered in ED Medications  acetaminophen (TYLENOL) tablet 650 mg (650 mg Oral Given 10/06/20 2022)    ED Course  I have reviewed the triage vital signs and the nursing notes.  Pertinent labs & imaging results that were available during my care of the patient were reviewed by me and considered in my medical decision making (see chart for details).    MDM Rules/Calculators/A&P                          Patient presents to the ED with complaints of heat exhaustion.  Patient is nontoxic, initially febrile- resolved in the ED.  Benign exam at present.   Additional history obtained:  Additional history obtained from chart review & nursing note review.   Lab Tests:  I reviewed and interpreted labs, which included:  CBC: Mild leukocytosis.  CMP: Unremarkable APTT/PT/INR: WNL UA: no UTI Lactic acid: WNL Covid/flu: Negative CK:  WNL  Imaging Studies ordered:  CXR ordered by prior team I independently reviewed, formal radiology impression shows:  No active disease.   ED Course:  Patient presents to the emergency department with concern for heat exhaustion after being outside and elevated temperatures.  On arrival he was noted to be febrile with likely resultant tachycardia.  He received an infectious work-up from triage, this has been overall reassuring, no meningismus, no findings of pneumonia, abdomen is nontender without peritoneal signs, urinalysis without signs of infection.  Mild leukocytosis, however no identified acute source of infection.  Lactic acid within normal limits.  Patient is not septic appearing or toxic appearing.  Suspect elevated temperature from being out in the heat today.  Renal function remains within normal limits, he has no significant electrolyte derangement, and his CK is within normal limits.  Patient is feeling much better, he is currently asymptomatic, he received fluids in the ED and would like to go home.  He overall appears appropriate for discharge, recommended rest, hydration with electrolyte containing solutions, primary care follow-up.  I discussed results, treatment plan, need for follow-up, and return precautions with the patient. Provided opportunity for questions, patient confirmed understanding and is in agreement with plan.   Portions of this note were generated with Scientist, clinical (histocompatibility and immunogenetics). Dictation errors may occur despite best attempts at proofreading.  Final Clinical Impression(s) / ED Diagnoses Final diagnoses:  Heat exposure, initial encounter    Rx / DC Orders ED Discharge Orders     None        Cherly Anderson, PA-C 10/07/20 0255    Tegeler, Canary Brim, MD 10/07/20 [phone]

## 2020-10-07 NOTE — Discharge Instructions (Signed)
You were seen in the emergency department today for a fever after being out in the heat.  Your work-up in the emergency department was overall reassuring without findings of pneumonia or UTI.  Your kidney function and your electrolytes overall look good.  Please be sure to stay well-hydrated with electrolyte containing fluids.  Rest.  Avoid the heat for a few days.  Follow-up with your primary care provider within 48 hours.  Return to the emergency department for new or worsening symptoms including but not limited to return of fever, chest pain, trouble breathing, passing out, or any other concerns.

## 2020-10-08 LAB — URINE CULTURE: Culture: NO GROWTH

## 2020-10-11 LAB — CULTURE, BLOOD (SINGLE)
Culture: NO GROWTH
Special Requests: ADEQUATE

## 2021-01-21 ENCOUNTER — Encounter (HOSPITAL_COMMUNITY): Payer: Self-pay

## 2021-01-21 ENCOUNTER — Other Ambulatory Visit: Payer: Self-pay

## 2021-01-21 ENCOUNTER — Emergency Department (HOSPITAL_COMMUNITY)
Admission: EM | Admit: 2021-01-21 | Discharge: 2021-01-21 | Disposition: A | Discharge status: Home / Self Care | Payer: BC Managed Care – PPO | Attending: Emergency Medicine | Admitting: Emergency Medicine

## 2021-01-21 ENCOUNTER — Emergency Department (HOSPITAL_COMMUNITY): Payer: BC Managed Care – PPO

## 2021-01-21 DIAGNOSIS — S0101XA Laceration without foreign body of scalp, initial encounter: Secondary | ICD-10-CM | POA: Diagnosis not present

## 2021-01-21 DIAGNOSIS — F10929 Alcohol use, unspecified with intoxication, unspecified: Secondary | ICD-10-CM | POA: Insufficient documentation

## 2021-01-21 DIAGNOSIS — Z23 Encounter for immunization: Secondary | ICD-10-CM | POA: Insufficient documentation

## 2021-01-21 DIAGNOSIS — Y906 Blood alcohol level of 120-199 mg/100 ml: Secondary | ICD-10-CM | POA: Diagnosis not present

## 2021-01-21 DIAGNOSIS — W1809XA Striking against other object with subsequent fall, initial encounter: Secondary | ICD-10-CM | POA: Diagnosis not present

## 2021-01-21 DIAGNOSIS — W19XXXA Unspecified fall, initial encounter: Secondary | ICD-10-CM

## 2021-01-21 DIAGNOSIS — Y92009 Unspecified place in unspecified non-institutional (private) residence as the place of occurrence of the external cause: Secondary | ICD-10-CM | POA: Insufficient documentation

## 2021-01-21 DIAGNOSIS — F10920 Alcohol use, unspecified with intoxication, uncomplicated: Secondary | ICD-10-CM

## 2021-01-21 LAB — CBC WITH DIFFERENTIAL/PLATELET
Abs Immature Granulocytes: 0.19 10*3/uL — ABNORMAL HIGH (ref 0.00–0.07)
Basophils Absolute: 0.1 10*3/uL (ref 0.0–0.1)
Basophils Relative: 1 %
Eosinophils Absolute: 0.2 10*3/uL (ref 0.0–0.5)
Eosinophils Relative: 1 %
HCT: 50.6 % (ref 39.0–52.0)
Hemoglobin: 17.3 g/dL — ABNORMAL HIGH (ref 13.0–17.0)
Immature Granulocytes: 1 %
Lymphocytes Relative: 22 %
Lymphs Abs: 2.9 10*3/uL (ref 0.7–4.0)
MCH: 31.2 pg (ref 26.0–34.0)
MCHC: 34.2 g/dL (ref 30.0–36.0)
MCV: 91.3 fL (ref 80.0–100.0)
Monocytes Absolute: 1.1 10*3/uL — ABNORMAL HIGH (ref 0.1–1.0)
Monocytes Relative: 8 %
Neutro Abs: 8.8 10*3/uL — ABNORMAL HIGH (ref 1.7–7.7)
Neutrophils Relative %: 67 %
Platelets: 296 10*3/uL (ref 150–400)
RBC: 5.54 MIL/uL (ref 4.22–5.81)
RDW: 13.9 % (ref 11.5–15.5)
WBC: 13.2 10*3/uL — ABNORMAL HIGH (ref 4.0–10.5)
nRBC: 0 % (ref 0.0–0.2)

## 2021-01-21 LAB — BASIC METABOLIC PANEL
Anion gap: 11 (ref 5–15)
BUN: 14 mg/dL (ref 6–20)
CO2: 18 mmol/L — ABNORMAL LOW (ref 22–32)
Calcium: 9.3 mg/dL (ref 8.9–10.3)
Chloride: 109 mmol/L (ref 98–111)
Creatinine, Ser: 0.76 mg/dL (ref 0.61–1.24)
GFR, Estimated: >60 mL/min (ref >60)
Glucose, Bld: 104 mg/dL — ABNORMAL HIGH (ref 70–99)
Potassium: 3.6 mmol/L (ref 3.5–5.1)
Sodium: 138 mmol/L (ref 135–145)

## 2021-01-21 LAB — ETHANOL: Alcohol, Ethyl (B): 130 mg/dL — ABNORMAL HIGH (ref <10)

## 2021-01-21 MED ORDER — TETANUS-DIPHTH-ACELL PERTUSSIS 5-2.5-18.5 LF-MCG/0.5 IM SUSY
0.5000 mL | PREFILLED_SYRINGE | Freq: Once | INTRAMUSCULAR | Status: AC
  Administered 2021-01-21: 0.5 mL via INTRAMUSCULAR
  Filled 2021-01-21: qty 0.5

## 2021-01-21 MED ORDER — LIDOCAINE-EPINEPHRINE-TETRACAINE (LET) TOPICAL GEL
3.0000 mL | Freq: Once | TOPICAL | Status: AC
  Administered 2021-01-21: 3 mL via TOPICAL
  Filled 2021-01-21: qty 3

## 2021-01-21 MED ORDER — LIDOCAINE-EPINEPHRINE (PF) 2 %-1:200000 IJ SOLN
10.0000 mL | Freq: Once | INTRAMUSCULAR | Status: DC
  Filled 2021-01-21: qty 20

## 2021-01-21 NOTE — ED Triage Notes (Signed)
[facility] and had 5 pints of fireball with Dr. Reino Kent since 20:30 and then stood up to go inside, patient hit the stone corner of a building. 1 inch abrasion (that he pulled open) to the back of his head. 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

## 2021-01-21 NOTE — Discharge Instructions (Signed)
You need to have your sutures removed in 1 week.  This can be done at primary care office, urgent care or here in the emergency department  Follow-up with your primary care provider as needed  Return for new or worsening symptoms

## 2021-01-21 NOTE — ED Provider Notes (Signed)
[facility]-EMERGENCY DEPT Provider Note   CSN: [id number] Arrival date & time: 01/21/21  0151     History Chief Complaint  Patient presents with   Fall   Alcohol Intoxication    Brian Simon is a 40 y.o. male with no significant past medical history who presents for evaluation of fall.  Was at home drinking fireball.  He stood up to go inside.  He states he hit the corner of a building fell backwards.  Hit the back of his head.  Denies any coagulation.  Patient is unsure if he had syncope or LOC.  He has been ambulatory since.  Unknown last tetanus.  He has abrasion and laceration to posterior scalp.  He is in c-collar.  He denies any headache, lightheadedness, dizziness, chest pain, shortness breath abdominal pain, diarrhea, dysuria.  Denies additional aggravating or relieving factors.  History obtained from patient and past medical records.  No interpreter used.  HPI     Past Medical History:  Diagnosis Date   Neuromuscular disorder [facility])     Patient Active Problem List   Diagnosis Date Noted   Fever, unspecified 05/07/2018    Past Surgical History:  Procedure Laterality Date   SPINE SURGERY         Family History  Problem Relation Age of Onset   Stroke Maternal Grandfather    Cancer Paternal Grandmother    Cancer Paternal Grandfather     Social History   Tobacco Use   Smoking status: Never   Smokeless tobacco: Never  Vaping Use   Vaping Use: Never used  Substance Use Topics   Alcohol use: Yes    Comment: "mixed drinks of liquor"   Drug use: No    Home Medications Prior to Admission medications   Medication Sig Start Date End Date Taking? Authorizing Provider  ibuprofen (ADVIL,MOTRIN) 200 MG tablet Take 400 mg by mouth every 6 (six) hours as needed for mild pain.   Yes [provider]  loratadine (CLARITIN) 10 MG tablet Take 10 mg by mouth daily.   Yes [provider]  Multiple Vitamin (MULTIVITAMIN ADULT PO)  Take 1 tablet by mouth daily.   Yes [provider]    Allergies    Patient has no known allergies.  Review of Systems   Review of Systems  Constitutional: Negative.   HENT: Negative.    Respiratory: Negative.    Cardiovascular: Negative.   Gastrointestinal: Negative.   Genitourinary: Negative.   Musculoskeletal: Negative.   Skin:  Positive for wound.  Neurological:  Positive for syncope (possible fall vs syncope).  All other systems reviewed and are negative.  Physical Exam Updated Vital Signs BP 134/87 (BP Location: Left Arm)   Pulse 98   Temp 98 F (36.7 C) (Oral)   Resp 18   Ht 5\' 8"  (1.727 m)   Wt 85.5 kg   SpO2 100%   BMI 28.66 kg/m   Physical Exam Vitals and nursing note reviewed.  Constitutional:      General: He is not in acute distress.    Appearance: He is well-developed. He is not ill-appearing, toxic-appearing or diaphoretic.  HENT:     Head: Normocephalic.     Jaw: There is normal jaw occlusion.     Comments: Soft tissue swelling, abrasion as well as 2 cm laceration to posterior scalp.  No active bleeding. Eyes:     Pupils: Pupils are equal, round, and reactive to light.  Neck:  Trachea: Trachea and phonation normal.     Comments: C-collar present.  No midline tenderness Cardiovascular:     Rate and Rhythm: Normal rate and regular rhythm.     Pulses: Normal pulses.          Radial pulses are 2+ on the right side and 2+ on the left side.     Heart sounds: Normal heart sounds.  Pulmonary:     Effort: Pulmonary effort is normal. No respiratory distress.     Breath sounds: Normal breath sounds and air entry.  Chest:     Comments: Nontender, no step-off, crepitus Abdominal:     General: Bowel sounds are normal. There is no distension.     Palpations: Abdomen is soft.     Tenderness: There is no abdominal tenderness.     Comments: Soft, nontender  Musculoskeletal:        General: Normal range of motion.     Cervical back: Full passive  range of motion without pain, normal range of motion and neck supple.     Comments: Moves all 4 extremities at difficulty.  No bony tenderness.  No C/T/L tenderness or step-off  Skin:    General: Skin is warm and dry.     Capillary Refill: Capillary refill takes less than 2 seconds.     Comments: 2 cm laceration posterior scalp, overlying abrasion.  No active bleeding  Neurological:     General: No focal deficit present.     Mental Status: He is alert.     Sensory: Sensation is intact.     Motor: Motor function is intact.     Comments: CN 2-12 intact Smells of alcohol however follows commands Ambulatory, texting on phone upon entering the room Equal grip bilaterally intact sensation    ED Results / Procedures / Treatments   Labs (all labs ordered are listed, but only abnormal results are displayed) Labs Reviewed  CBC WITH DIFFERENTIAL/PLATELET - Abnormal; Notable for the following components:      Result Value   WBC 13.2 (*)    Hemoglobin 17.3 (*)    Neutro Abs 8.8 (*)    Monocytes Absolute 1.1 (*)    Abs Immature Granulocytes 0.19 (*)    All other components within normal limits  BASIC METABOLIC PANEL - Abnormal; Notable for the following components:   CO2 18 (*)    Glucose, Bld 104 (*)    All other components within normal limits  ETHANOL - Abnormal; Notable for the following components:   Alcohol, Ethyl (B) 130 (*)    All other components within normal limits    EKG EKG Interpretation  Date/Time:  Friday January 21 2021 03:54:04 EDT Ventricular Rate:  94 PR Interval:  145 QRS Duration: 83 QT Interval:  340 QTC Calculation: 426 R Axis:   59 Text Interpretation: Sinus rhythm Probable left atrial enlargement nonspecific ST segments similar to June 2022 Confirmed by Pricilla Loveless [phone]) on 01/21/2021 5:09:38 AM  Radiology CT HEAD WO CONTRAST ( )  Result Date: 01/21/2021 CLINICAL DATA:  Head trauma with abnormal mental status. EXAM: CT HEAD WITHOUT CONTRAST  CT CERVICAL SPINE WITHOUT CONTRAST TECHNIQUE: Multidetector CT imaging of the head and cervical spine was performed following the standard protocol without intravenous contrast. Multiplanar CT image reconstructions of the cervical spine were also generated. COMPARISON:  None. FINDINGS: CT HEAD FINDINGS Brain: No evidence of acute infarction, hemorrhage, hydrocephalus, extra-axial collection or mass lesion/mass effect. Subtle hyperdensity in the anterior interhemispheric fissures attributed to [facility]  vessels. Vascular: No hyperdense vessel or unexpected calcification. Skull: Swelling and gas in the high left scalp on the left. Hazy adjacent scalp thickening, reportedly a neurofibroma present since birth. Sinuses/Orbits: Negative CT CERVICAL SPINE FINDINGS Alignment: Chronic kyphotic deformity. Skull base and vertebrae: Fusion from C2-C5 with laminectomy. No acute fracture. Dysmorphic right rib head necks. Soft tissues and spinal canal: No prevertebral fluid or swelling. No visible canal hematoma. Disc levels: Fusion is noted above. There is adjacent disc narrowing and ridging. 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 Upper chest: No visible injury IMPRESSION: 1. No evidence of acute intracranial or cervical spine injury. 2. Scalp contusion without calvarial fracture. 3. Chronic findings are described above. Electronically Signed   By: Tiburcio Pea M.D.   On: 01/21/2021 05:44   CT Cervical Spine Wo Contrast  Result Date: 01/21/2021 CLINICAL DATA:  Head trauma with abnormal mental status. EXAM: CT HEAD WITHOUT CONTRAST CT CERVICAL SPINE WITHOUT CONTRAST TECHNIQUE: Multidetector CT imaging of the head and cervical spine was performed following the standard protocol without intravenous contrast. Multiplanar CT image reconstructions of the cervical spine were also generated. COMPARISON:  None. FINDINGS: CT HEAD FINDINGS Brain: No  evidence of acute infarction, hemorrhage, hydrocephalus, extra-axial collection or mass lesion/mass effect. Subtle hyperdensity in the anterior interhemispheric fissures attributed to ACA vessels. Vascular: No hyperdense vessel or unexpected calcification. Skull: Swelling and gas in the high left scalp on the left. Hazy adjacent scalp thickening, reportedly a neurofibroma present since birth. Sinuses/Orbits: Negative CT CERVICAL SPINE FINDINGS Alignment: Chronic kyphotic deformity. Skull base and vertebrae: Fusion from C2-C5 with laminectomy. No acute fracture. Dysmorphic right rib head necks. Soft tissues and spinal canal: No prevertebral fluid or swelling. No visible canal hematoma. Disc levels: Fusion is noted above. There is adjacent disc narrowing and ridging. 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 Upper chest: No visible injury IMPRESSION: 1. No evidence of acute intracranial or cervical spine injury. 2. Scalp contusion without calvarial fracture. 3. Chronic findings are described above. Electronically Signed   By: Tiburcio Pea M.D.   On: 01/21/2021 05:44    Procedures .Marland KitchenLaceration Repair  Date/Time: 01/21/2021 5:53 AM Performed by: Linwood Dibbles, PA-C Authorized by: Linwood Dibbles, PA-C   Consent:    Consent obtained:  Verbal   Consent given by:  Patient   Risks, benefits, and alternatives were discussed: yes     Risks discussed:  Infection, pain, retained foreign body, tendon damage, vascular damage, poor wound healing, poor cosmetic result, need for additional repair and nerve damage   Alternatives discussed:  No treatment, delayed treatment, observation and referral Universal protocol:    Procedure explained and questions answered to patient or proxy's satisfaction: yes     Relevant documents present and verified: yes     Test results available: yes     Imaging studies available: yes      Required blood products, implants, devices, and special equipment available: yes     Site/side marked: yes     Immediately prior to procedure, a time out was called: yes     Patient identity confirmed:  Verbally with patient Anesthesia:    Anesthesia method:  Topical application   Topical anesthetic:  LET Laceration details:    Location:  Scalp   Scalp location:  Crown   Length (cm):  2   Depth (mm):  3  Pre-procedure details:    Preparation:  Patient was prepped and draped in usual sterile fashion and imaging obtained to evaluate for foreign bodies Exploration:    Limited defect created (wound extended): no     Hemostasis achieved with:  Direct pressure   Imaging obtained comment:  CT imaging   Imaging outcome: foreign body not noted     Wound exploration: wound explored through full range of motion and entire depth of wound visualized     Wound extent: no foreign bodies/material noted, no muscle damage noted, no tendon damage noted, no underlying fracture noted and no vascular damage noted   Treatment:    Area cleansed with:  Povidone-iodine   Amount of cleaning:  Extensive   Irrigation solution:  Sterile saline   Irrigation method:  Pressure wash   Debridement:  None Skin repair:    Repair method:  Sutures   Suture size:  4-0   Suture material:  Prolene   Suture technique:  Simple interrupted   Number of sutures:  2 Approximation:    Approximation:  Close Repair type:    Repair type:  Intermediate Post-procedure details:    Dressing:  Open (no dressing)   Procedure completion:  Tolerated well, no immediate complications   Medications Ordered in ED Medications  lidocaine-EPINEPHrine (XYLOCAINE W/EPI) 2 %-1:200000 (PF) injection 10 mL (has no administration in time range)  Tdap (BOOSTRIX) injection 0.5 mL (0.5 mLs Intramuscular Given 01/21/21 0413)  lidocaine-EPINEPHrine-tetracaine (LET) topical gel (3 mLs Topical Given 01/21/21 0432)   ED Course  I have reviewed the  triage vital signs and the nursing notes.  Pertinent labs & imaging results that were available during my care of the patient were reviewed by me and considered in my medical decision making (see chart for details).  Here for evaluation of fall versus syncope.  He admits to EtOH use today.  Went to stand up, went around a corner and the next thing he remembers he is on the ground.  Patient denies any syncope however bystanders state questionable.  Does smell of alcohol however follows commands, ambulatory here.  Has abrasion laceration to posterior scalp.  He is in a c-collar, unable to clear C-spine Via [facility] criteria due to EtOH use.  He has no other bony tenderness.  Plan on imaging, labs  Labs and imaging personally reviewed and interpreted:  CBC WBC 13.2 no obvious infectious symptoms on exam BMP no significant abnormality Ethanol 130 CT head no acute abnormality CT cervical no acute abnormality EKG no ischemic changes, similar to prior  Patient reassessed.  He is A/O x3.  Ambulatory in ED.  Clinically sober.  Reassuring work-up.  For cosmetic purposes to place sutures to his laceration on the scalp.  Need to have these removed in 1 week.  Discussed close follow-up outpatient with PCP.  He is agreeable.  The patient has been appropriately medically screened and/or stabilized in the ED. I have low suspicion for any other emergent medical condition which would require further screening, evaluation or treatment in the ED or require inpatient management.  Patient is hemodynamically stable and in no acute distress.  Patient able to ambulate in department prior to ED.  Evaluation does not show acute pathology that would require ongoing or additional emergent interventions while in the emergency department or further inpatient treatment.  I have discussed the diagnosis with the patient and answered all questions.  Pain is been managed while in the emergency department and patient has no further  complaints prior to discharge.  Patient is comfortable with plan discussed in room and is stable for discharge at this time.  I have discussed strict return precautions for returning to the emergency department.  Patient was encouraged to follow-up with PCP/specialist refer to at discharge.,dmdc    MDM Rules/Calculators/A&P                            Final Clinical Impression(s) / ED Diagnoses Final diagnoses:  Alcoholic intoxication without complication (HCC)  Fall, initial encounter  Laceration of scalp, initial encounter    Rx / DC Orders ED Discharge Orders     None        Laureen Frederic A, PA-C 01/21/21 0559    Pricilla Loveless, MD 01/21/21 [phone]

## 2021-01-29 ENCOUNTER — Encounter (HOSPITAL_COMMUNITY): Payer: Self-pay | Admitting: Emergency Medicine

## 2021-01-29 ENCOUNTER — Emergency Department (HOSPITAL_COMMUNITY)
Admission: EM | Admit: 2021-01-29 | Discharge: 2021-01-29 | Disposition: A | Discharge status: Home / Self Care | Payer: BC Managed Care – PPO | Attending: Emergency Medicine | Admitting: Emergency Medicine

## 2021-01-29 ENCOUNTER — Other Ambulatory Visit: Payer: Self-pay

## 2021-01-29 DIAGNOSIS — Z4802 Encounter for removal of sutures: Secondary | ICD-10-CM | POA: Insufficient documentation

## 2021-01-29 NOTE — Discharge Instructions (Addendum)
You have been seen in the ED for suture removal. I have removed two stitches from your scalp.

## 2021-01-29 NOTE — ED Provider Notes (Signed)
[facility]-EMERGENCY DEPT Provider Note   CSN: [id number] Arrival date & time: 01/29/21  1147     History Chief Complaint  Patient presents with   Suture / Staple Removal    BOGDAN VIVONA is a 40 y.o. male. Patient presents to the emergency department today for suture removal.  He had 2 sutures to his posterior scalp that been there for about 8 days.  He originally injured the area when falling backwards on the corner of a building hitting his head.  He denies any fevers or chills.  Denies any concerns with the area.   Suture / Staple Removal Pertinent negatives include no chest pain, no abdominal pain, no headaches and no shortness of breath.      Past Medical History:  Diagnosis Date   Neuromuscular disorder [facility])     Patient Active Problem List   Diagnosis Date Noted   Fever, unspecified 05/07/2018    Past Surgical History:  Procedure Laterality Date   SPINE SURGERY         Family History  Problem Relation Age of Onset   Stroke Maternal Grandfather    Cancer Paternal Grandmother    Cancer Paternal Grandfather     Social History   Tobacco Use   Smoking status: Never   Smokeless tobacco: Never  Vaping Use   Vaping Use: Never used  Substance Use Topics   Alcohol use: Yes    Comment: "mixed drinks of liquor"   Drug use: No    Home Medications Prior to Admission medications   Medication Sig Start Date End Date Taking? Authorizing Provider  ibuprofen (ADVIL,MOTRIN) 200 MG tablet Take 400 mg by mouth every 6 (six) hours as needed for mild pain.    [provider]  loratadine (CLARITIN) 10 MG tablet Take 10 mg by mouth daily.    [provider]  Multiple Vitamin (MULTIVITAMIN ADULT PO) Take 1 tablet by mouth daily.    [provider]    Allergies    Patient has no known allergies.  Review of Systems   Review of Systems  Constitutional:  Negative for chills and fever.  HENT:  Negative for congestion,  rhinorrhea and sore throat.   Eyes:  Negative for visual disturbance.  Respiratory:  Negative for cough, chest tightness and shortness of breath.   Cardiovascular:  Negative for chest pain, palpitations and leg swelling.  Gastrointestinal:  Negative for abdominal pain, blood in stool, constipation, diarrhea, nausea and vomiting.  Genitourinary:  Negative for dysuria, flank pain and hematuria.  Musculoskeletal:  Negative for back pain.  Skin:  Positive for wound. Negative for rash.  Neurological:  Negative for dizziness, syncope, weakness, light-headedness and headaches.  Psychiatric/Behavioral:  Negative for confusion.   All other systems reviewed and are negative.  Physical Exam Updated Vital Signs BP (!) 151/101   Pulse 89   Temp 98.8 F (37.1 C)   Resp 18   SpO2 98%   Physical Exam Vitals and nursing note reviewed.  Constitutional:      General: He is not in acute distress.    Appearance: Normal appearance. He is well-developed. He is not ill-appearing, toxic-appearing or diaphoretic.  HENT:     Head: Normocephalic and atraumatic.     Nose: No nasal deformity.     Mouth/Throat:     Lips: Pink. No lesions.  Eyes:     General: Gaze aligned appropriately. No scleral icterus.       Right eye: No discharge.  Left eye: No discharge.     Conjunctiva/sclera: Conjunctivae normal.     Right eye: Right conjunctiva is not injected. No exudate or hemorrhage.    Left eye: Left conjunctiva is not injected. No exudate or hemorrhage. Pulmonary:     Effort: Pulmonary effort is normal. No respiratory distress.  Skin:    General: Skin is warm and dry.     Findings: Wound present.     Comments: 2 sutures located to posterior.  There is no erythema in surrounding area.  Wound appears to have closed with no complications.  No signs of infection.  Neurological:     Mental Status: He is alert and oriented to person, place, and time.  Psychiatric:        Mood and Affect: Mood normal.         Speech: Speech normal.        Behavior: Behavior normal. Behavior is cooperative.    ED Results / Procedures / Treatments   Labs (all labs ordered are listed, but only abnormal results are displayed) Labs Reviewed - No data to display  EKG None  Radiology No results found.  Procedures Procedures   Medications Ordered in ED Medications - No data to display  ED Course  I have reviewed the triage vital signs and the nursing notes.  Pertinent labs & imaging results that were available during my care of the patient were reviewed by me and considered in my medical decision making (see chart for details).    MDM Rules/Calculators/A&P                         Patient presents to the emergency department for suture removal.  Sutures of been present for 8 days.  He originally injured his area after hitting the corner of a building on the back of his head.  The area does not appear to have any signs of infection.  The area is healed very well.  I removed the 2 stitches.  I examined area with no signs of bleeding following removal.  Patient is given instructions for stitch removal care.   Final Clinical Impression(s) / ED Diagnoses Final diagnoses:  Visit for suture removal    Rx / DC Orders ED Discharge Orders     None        Claudie Leach, PA-C 01/29/21 1223    Horton, Clabe Seal, DO 01/30/21 0809

## 2021-01-29 NOTE — ED Triage Notes (Signed)
Patient presents for suture removal. Suture are located on the top of his head.

## 2023-01-20 IMAGING — CT CT CERVICAL SPINE W/O CM
3 of 4 series · 12 of 33 positions shown, 14 images · non-contrast
Comparison: None.

CLINICAL DATA: Head trauma with abnormal mental status.

EXAM:
CT HEAD WITHOUT CONTRAST
CT CERVICAL SPINE WITHOUT CONTRAST
TECHNIQUE: Multidetector CT imaging of the head and cervical spine was
performed following the standard protocol without intravenous
contrast. Multiplanar CT image reconstructions of the cervical spine
were also generated.

[Series 6: orthogonal bone · axial · 0.21mm/px · z∈[+1635,+1759]mm · 4 of 100 slices shown, 5 images]
[im 17/100  soft-tissue]
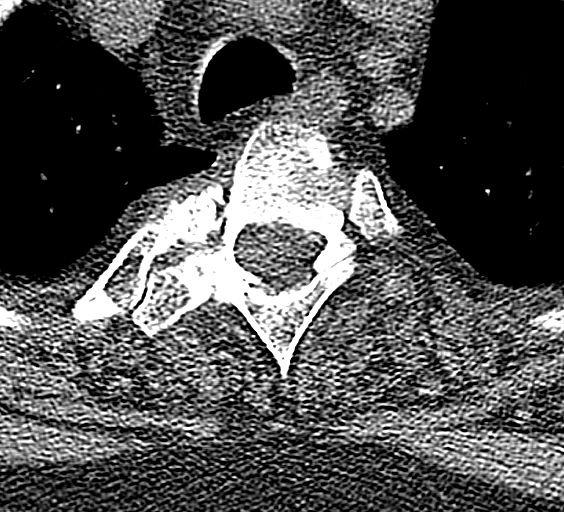
[im 17/100  bone]
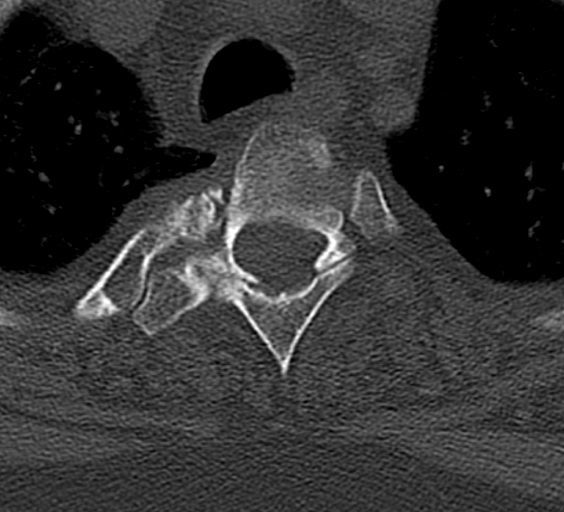
[im 34/100  bone]
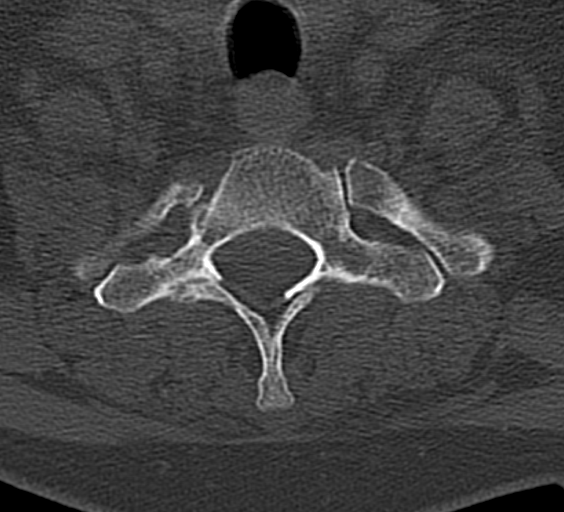
[im 67/100  bone]
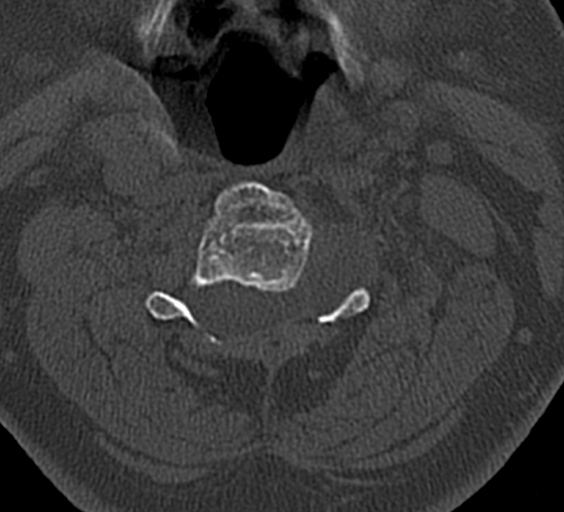
[im 83/100  bone]
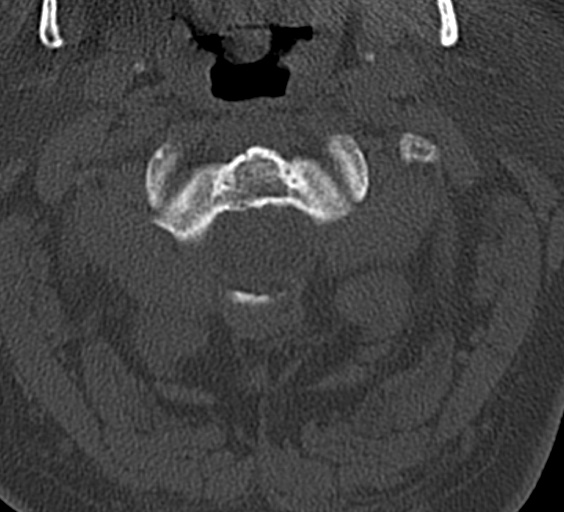

[Series 7: coronal bone · coronal · 0.30mm/px · 3 of 55 slices shown]
[im 11/55  bone]
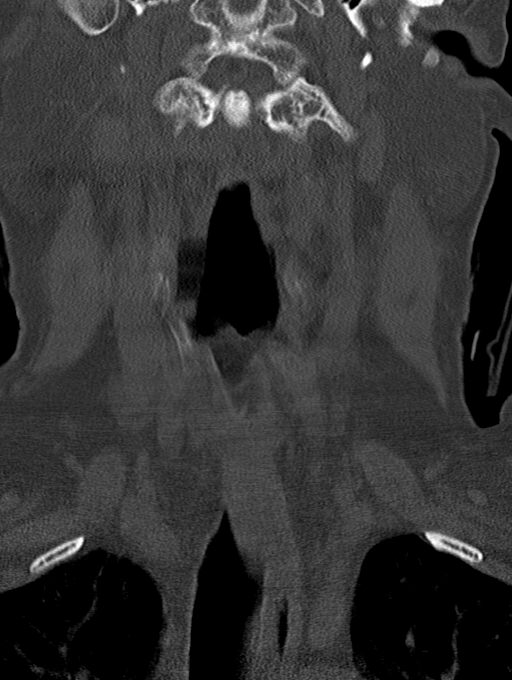
[im 22/55  bone]
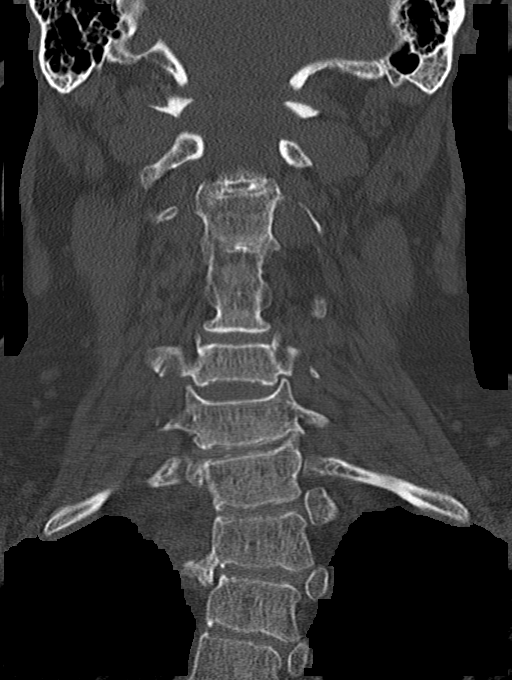
[im 33/55  bone]
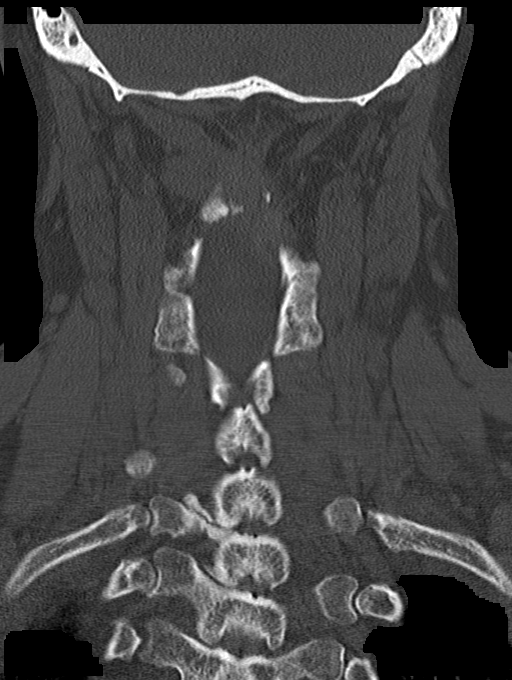

[Series 8: sagittal bone · sagittal · 0.24mm/px · 5 of 61 slices shown, 6 images]
[im 21/61  bone]
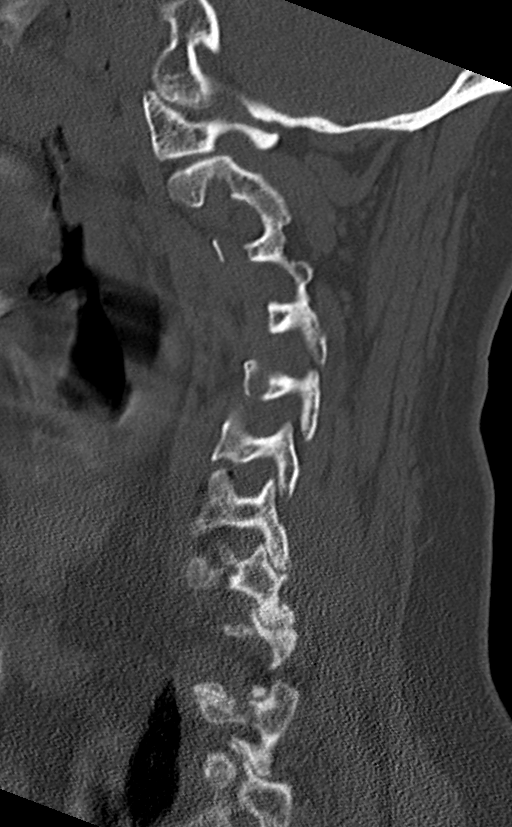
[im 26/61  bone]
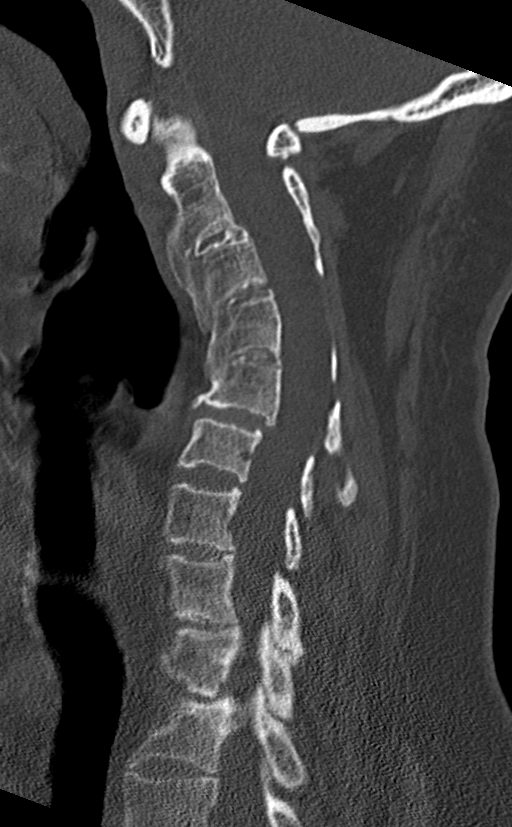
[im 31/61  soft-tissue]
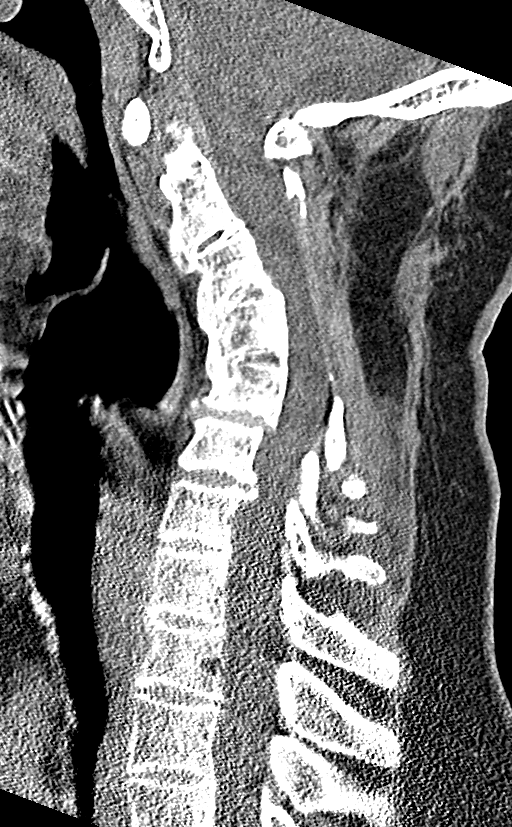
[im 31/61  bone]
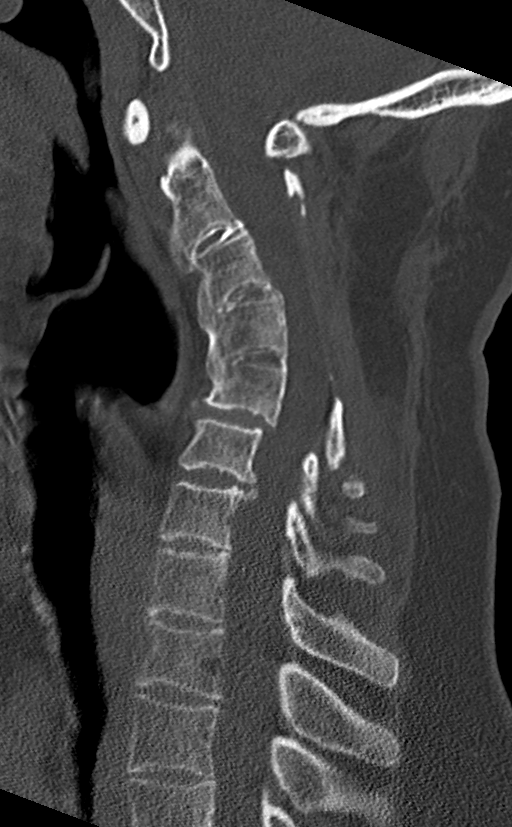
[im 36/61  bone]
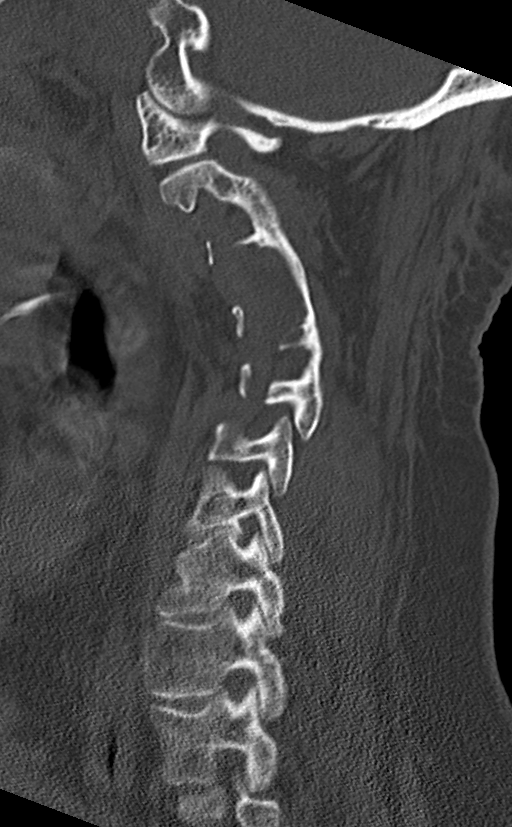
[im 41/61  bone]
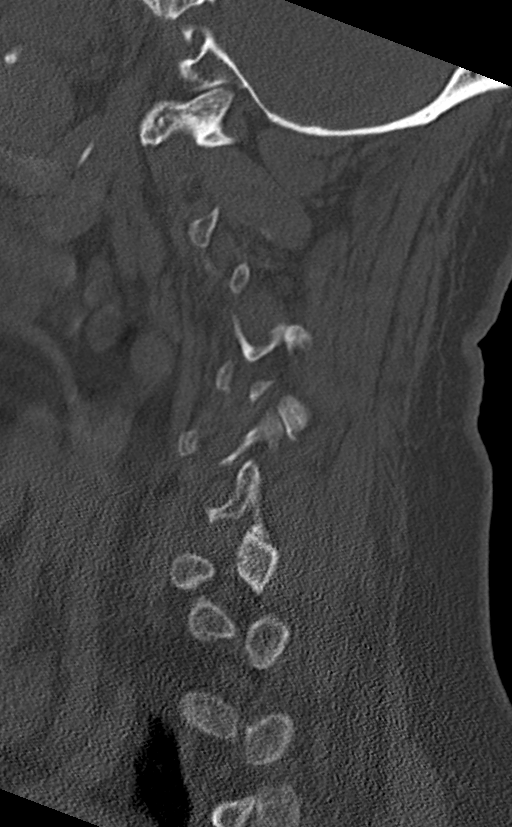

[12 of 33 positions shown; findings below may reference images not displayed]

FINDINGS: CT HEAD FINDINGS

Brain: No evidence of acute infarction, hemorrhage, hydrocephalus,
extra-axial collection or mass lesion/mass effect. Subtle
hyperdensity in the anterior interhemispheric fissures attributed to
ACA vessels.

Vascular: No hyperdense vessel or unexpected calcification.

Skull: Swelling and gas in the high left scalp on the left. Hazy
adjacent scalp thickening, reportedly a neurofibroma present since
birth.

Sinuses/Orbits: Negative

CT CERVICAL SPINE FINDINGS

Alignment: Chronic kyphotic deformity.

Skull base and vertebrae: Fusion from C2-C5 with laminectomy. No
acute fracture. Dysmorphic right rib head necks.

Soft tissues and spinal canal: No prevertebral fluid or swelling. No
visible canal hematoma.

Disc levels: Fusion is noted above. There is adjacent disc narrowing
and ridging. Foraminal widening especially on the left at C3-4,
presumably related to nerve sheath tumor given history of
neurofibroma.

Upper chest: No visible injury
IMPRESSION: 1. No evidence of acute intracranial or cervical spine injury.
2. Scalp contusion without calvarial fracture.
3. Chronic findings are described above.
